# Patient Record
Sex: Female | Born: 1965
Health system: Southern US, Community
[De-identification: ages and names within clinical notes are randomized; demographics above are authoritative.]

## PROBLEM LIST (undated history)

## (undated) DIAGNOSIS — T7840XA Allergy, unspecified, initial encounter: Secondary | ICD-10-CM

## (undated) DIAGNOSIS — M549 Dorsalgia, unspecified: Secondary | ICD-10-CM

## (undated) DIAGNOSIS — R12 Heartburn: Secondary | ICD-10-CM

## (undated) DIAGNOSIS — E7800 Pure hypercholesterolemia, unspecified: Secondary | ICD-10-CM

## (undated) DIAGNOSIS — H409 Unspecified glaucoma: Secondary | ICD-10-CM

## (undated) DIAGNOSIS — I10 Essential (primary) hypertension: Secondary | ICD-10-CM

## (undated) DIAGNOSIS — H269 Unspecified cataract: Secondary | ICD-10-CM

## (undated) DIAGNOSIS — R7303 Prediabetes: Secondary | ICD-10-CM

## (undated) DIAGNOSIS — E559 Vitamin D deficiency, unspecified: Secondary | ICD-10-CM

## (undated) DIAGNOSIS — R011 Cardiac murmur, unspecified: Secondary | ICD-10-CM

## (undated) DIAGNOSIS — M542 Cervicalgia: Secondary | ICD-10-CM

## (undated) HISTORY — DX: Allergy, unspecified, initial encounter: T78.40XA

## (undated) HISTORY — DX: Vitamin D deficiency, unspecified: E55.9

## (undated) HISTORY — DX: Cardiac murmur, unspecified: R01.1

## (undated) HISTORY — DX: Pure hypercholesterolemia, unspecified: E78.00

## (undated) HISTORY — DX: Dorsalgia, unspecified: M54.9

## (undated) HISTORY — DX: Cervicalgia: M54.2

## (undated) HISTORY — DX: Unspecified cataract: H26.9

## (undated) HISTORY — DX: Essential (primary) hypertension: I10

## (undated) HISTORY — PX: OOPHORECTOMY: SHX86

## (undated) HISTORY — DX: Heartburn: R12

## (undated) HISTORY — PX: CARPAL TUNNEL RELEASE: SHX101

## (undated) HISTORY — DX: Prediabetes: R73.03

## (undated) HISTORY — DX: Unspecified glaucoma: H40.9

---

## 2000-10-15 ENCOUNTER — Encounter: Payer: Self-pay | Admitting: Family Medicine

## 2000-10-15 ENCOUNTER — Ambulatory Visit (HOSPITAL_COMMUNITY): Admission: RE | Admit: 2000-10-15 | Discharge: 2000-10-15 | Payer: Self-pay | Admitting: Family Medicine

## 2000-11-18 ENCOUNTER — Encounter: Payer: Self-pay | Admitting: Obstetrics and Gynecology

## 2000-11-18 ENCOUNTER — Ambulatory Visit (HOSPITAL_COMMUNITY): Admission: RE | Admit: 2000-11-18 | Discharge: 2000-11-18 | Payer: Self-pay | Admitting: Obstetrics and Gynecology

## 2001-02-05 ENCOUNTER — Encounter (INDEPENDENT_AMBULATORY_CARE_PROVIDER_SITE_OTHER): Payer: Self-pay

## 2001-02-05 ENCOUNTER — Inpatient Hospital Stay (HOSPITAL_COMMUNITY): Admission: RE | Admit: 2001-02-05 | Discharge: 2001-02-06 | Payer: Self-pay | Admitting: Obstetrics and Gynecology

## 2002-01-30 ENCOUNTER — Emergency Department (HOSPITAL_COMMUNITY): Admission: EM | Admit: 2002-01-30 | Discharge: 2002-01-30 | Payer: Self-pay | Admitting: Emergency Medicine

## 2002-02-05 ENCOUNTER — Encounter: Payer: Self-pay | Admitting: *Deleted

## 2002-02-05 ENCOUNTER — Ambulatory Visit (HOSPITAL_COMMUNITY): Admission: RE | Admit: 2002-02-05 | Discharge: 2002-02-05 | Payer: Self-pay | Admitting: *Deleted

## 2003-06-02 ENCOUNTER — Other Ambulatory Visit: Admission: RE | Admit: 2003-06-02 | Discharge: 2003-06-02 | Payer: Self-pay | Admitting: Family Medicine

## 2008-02-16 ENCOUNTER — Other Ambulatory Visit: Admission: RE | Admit: 2008-02-16 | Discharge: 2008-02-16 | Payer: Self-pay | Admitting: Family Medicine

## 2008-10-08 ENCOUNTER — Ambulatory Visit (HOSPITAL_BASED_OUTPATIENT_CLINIC_OR_DEPARTMENT_OTHER): Admission: RE | Admit: 2008-10-08 | Discharge: 2008-10-08 | Payer: Self-pay | Admitting: Orthopedic Surgery

## 2008-10-22 ENCOUNTER — Ambulatory Visit (HOSPITAL_BASED_OUTPATIENT_CLINIC_OR_DEPARTMENT_OTHER): Admission: RE | Admit: 2008-10-22 | Discharge: 2008-10-22 | Payer: Self-pay | Admitting: Orthopedic Surgery

## 2009-02-10 HISTORY — PX: CARPAL TUNNEL RELEASE: SHX101

## 2009-03-13 HISTORY — PX: CARPAL TUNNEL RELEASE: SHX101

## 2010-11-23 LAB — POCT HEMOGLOBIN-HEMACUE: Hemoglobin: 13.3 g/dL (ref 12.0–15.0)

## 2010-11-28 LAB — POCT HEMOGLOBIN-HEMACUE: Hemoglobin: 13.3 g/dL (ref 12.0–15.0)

## 2010-12-26 NOTE — Op Note (Signed)
Haley Moreno, Haley Moreno            ACCOUNT NO.:  [id number]   MEDICAL RECORD NO.:  [id number]          PATIENT TYPE:  AMB   LOCATION:  DSC                          FACILITY:  [facility]   PHYSICIAN:  Katy Fitch. Sypher, M.D. DATE OF BIRTH:  Feb 01, 1966   DATE OF PROCEDURE:  10/22/2008  DATE OF DISCHARGE:                               OPERATIVE REPORT   PREOPERATIVE DIAGNOSIS:  Entrapment neuropathy, median nerve, left  carpal tunnel.   POSTOPERATIVE DIAGNOSIS:  Entrapment neuropathy, median nerve, left  carpal tunnel.   OPERATION:  Release of left transverse carpal ligament.   OPERATING SURGEON:  Katy Fitch. Sypher, MD   ASSISTANT:  Marveen Reeks Dasnoit, PA-C   ANESTHESIA:  General by LMA.   SUPERVISING ANESTHESIOLOGIST:  Zenon Mayo, MD   INDICATIONS:  Haley Moreno is a 45 year old employee of [employer].  She presented for evaluation and management of hand numbness  and discomfort.  Clinical examination revealed signs of bilateral carpal  tunnel syndrome.  We have been unsuccessful controlling her symptoms  with nonoperative measures; therefore, she is brought to the operating  at this time for release of her transverse carpal ligaments.  She is  status post release of the right transverse carpal ligament with an  excellent short-term result.  She now presents for release of the left  transverse carpal ligament.   Preoperatively, questions were invited and answered in detail.   PROCEDURE:  Haley Moreno was brought to the operating room and  placed in the supine position upon the operating table.   Following induction of general anesthesia by LMA technique, the left arm  was prepped with Betadine soap solution and sterilely draped.  A  pneumatic tourniquet was applied to the proximal left brachium.   Following exsanguination of the left arm with Esmarch bandage, arterial  tourniquet was inflated to 220 mmHg.  The procedure commenced with a  short incision  in the line of the ring finger in the palm.  Subcutaneous  tissues were carefully divided revealing the palmar fascia.  This was  split longitudinally to reveal the common sensory branch of the median  nerve.  These were followed back to transverse carpal ligament, which  was gently isolated from median nerve.  The ligament was then released  along its ulnar border extending into the distal forearm.  This widely  opened carpal canal.  No masses or other predicaments were noted.  Bleeding points along the margin of the released ligament were  electrocauterized with bipolar current followed by repair of the skin  with intradermal 3-0 Prolene suture.  A compressive dressing was applied  with a volar plaster splint maintaining the wrist in 5 degrees of  dorsiflexion.   For aftercare, Haley Moreno was provided Percocet for pain.  I will see  her back for followup in 1 week or sooner p.r.n. problems.      Katy Fitch Sypher, M.D.  Electronically Signed     RVS/MEDQ  D:  10/22/2008  T:  10/22/2008  Job:  [id number]   cc:   Renaye Rakers, M.D.

## 2010-12-26 NOTE — Op Note (Signed)
Haley Moreno, Haley Moreno            ACCOUNT NO.:  [id number]   MEDICAL RECORD NO.:  [id number]          PATIENT TYPE:  AMB   LOCATION:  DSC                          FACILITY:  [facility]   PHYSICIAN:  Katy Fitch. Sypher, M.D. DATE OF BIRTH:  Apr 05, 1966   DATE OF PROCEDURE:  10/08/2008  DATE OF DISCHARGE:                               OPERATIVE REPORT   PREOPERATIVE DIAGNOSIS:  Entrapped neuropathy, right median nerve at  carpal tunnel.   POSTOPERATIVE DIAGNOSIS:  Entrapped neuropathy, right median nerve at  carpal tunnel.   OPERATION:  Release of right transcarpal ligament.   OPERATIONS:  Katy Fitch. Sypher, MD   ASSISTANT:  Marveen Reeks Dasnoit, PA-C   ANESTHESIA:  General by LMA.   SUPERVISING ANESTHESIOLOGIST:  Quita Skye. Krista Blue, MD   INDICATIONS:  Haley Moreno is a 45 year old woman referred for  evaluation and management of hand numbness.  Clinical examination  revealed signs of significant bilateral carpal tunnel syndrome.   Electrodiagnostic studies confirmed significant median neuropathy.   Due to failed respond to nonoperative measures, she is brought to the  operating room at this time for release of her right transverse carpal  ligament.   Preoperatively, she was interviewed by Dr. Krista Blue.  General anesthesia  by LMA technique was recommended and accepted.  She was subsequently  brought to room #1 of the [facility], placed in the supine  position on the operating table, and under Dr. Robina Ade direct  supervision general anesthesia by LMA technique induced.  The right arm  was then prepped with Betadine soap and solution, sterilely draped.  A  pneumatic tourniquet was applied to proximal brachium.   Following exsanguination of the arm with an Esmarch bandage, arterial  tourniquet was inflated to 230 mmHg.  The procedure commenced with a  short incision in the line of the ring finger of the palm.  Subcutaneous  tissues were carefully divided via the palmar  fascia.  This split  longitudinally to the common sensory branch of the median nerve.  These  were followed back to transcarpal ligament which gently isolated from  median nerve.   The ligament was then released along its ulnar border extending into the  distal forearm.  This widely opened the carpal canal.  No masses or  other predicaments were noted.   Bleeding points along the margin of the released ligament were  electrocauterized with bipolar forceps.  Following complete release of  the transverse carpal ligament, the canal was widely opened.  No masses  or predicaments were noted.  The tenosynovium of the ulnar bursa was  quite fibrotic.   The wound was then repaired with intradermal 3-0 Prolene.  A compressive  dressing was with a volar plaster splint maintaining the wrist in 5  degrees of dorsiflexion.   For aftercare, Ms. Gellatly was provided a prescription for Vicodin 5 mg  1 p.o. q.4-6 h. p.r.n. pain, 20 tablets without refill.  We will see her  back in followup in 7-10 days for suture removal and initiation of her  therapy program.      Katy Fitch. Sypher, M.D.  Electronically  Signed     RVS/MEDQ  D:  10/08/2008  T:  10/08/2008  Job:  [id number]

## 2010-12-29 NOTE — Discharge Summary (Signed)
[facility]  Patient:    Haley Moreno, Haley Moreno                   MRN: [id number] Adm. Date:  [id number] Disc. Date: [id number] Attending:  Madelyn Flavors CC:         Stacie Acres. White, M.D. at [facility]   Discharge Summary  HISTORY OF PRESENT ILLNESS:   The patient is a 45 year old, gravida 5, para 3, black female who presented to our office at the end of March complaining of a three-week history of right lower quadrant pain. A CT scan had shown a right ovarian mass measuring 6.5 x 3.5 cm with several fat lobules consistent with a dermoid. She subsequently underwent an ultrasound which confirmed a benign cystic teratoma and was admitted for removal of this dermoid. For further details, please see the dictated history and physical.  HOSPITAL COURSE:              The patient was admitted and underwent an exploratory laparotomy, right salpingo-oophorectomy, exploration of the left ovary to rule out a bilateral dermoid. For further details, please see the operative report. On postoperative day #1, the patient was afebrile with stable vital signs and no calf tenderness, dressing was clean and dry. Her postoperative hemoglobin was noted to be 10.7. The patient at that point desired discharged. She was discharged home.  DISCHARGE MEDICATIONS:        Tylox, dispense 20, one to two p.o. q.3-4h. prn pain.  DISCHARGE INSTRUCTIONS:       The patient was urged to return for staple removal in one week and to call with any problems. She is also urged to ambulate but to do no heavy lifting. DD:  03/14/01 TD:  03/14/01 Job: [id number]

## 2010-12-29 NOTE — H&P (Signed)
[facility]  Patient:    Haley Moreno, Haley Moreno                     MRN: [id number] Adm. Date:  02/05/01 Attending:  Beather Arbour. Thomasena Edis, M.D.                         History and Physical  DATE OF BIRTH:                May 22, 1966  HISTORY OF PRESENT ILLNESS:   The patient is a 45 year old gravida 5, para 3, SAB black female who presented to my office at the end of March complaining of a three week history of right lower quadrant pain.  She had a CT scan of the abdomen ordered by her family physician, Dr. Laurann Montana, which showed a right ovarian mass measuring 6.5 x 3.5 cm with several fat lobules consistent with a dermoid.  She subsequently had pelvic ultrasound as a workup where she was found to have a mixed echogenic mass in the right ovary measuring 4.5 x 2.9 x 2.8 cm consistent with a small cystic teratoma with a small amount of fluid adjacent to the right ovary.  Because this is a cystic teratoma the patient was advised to have this removed.  She was made aware that we would attempt to do an ovarian cystectomy but a right salpingo-oophorectomy may be necessary.  She was also made aware of the bilateral reality of this.  Risks of surgery including anesthetic complications, hemorrhage, infection, damage to adjacent structures including bladder, bowel, blood vessels, ureters discussed with the patient.  She was made aware of postoperative risks which could include wound infection, urinary tract infection, and DVT which could result in a stroke or pulmonary embolism as well as other unforeseen risks. She expresses understanding of and acceptance of these risks and desires to proceed.  PAST GYNECOLOGICAL HISTORY:   Menarche at age 69.  Cycles are every 28 days with a three to four day duration of flow.  PAST OBSTETRICAL HISTORY:     Patient has had three spontaneous vaginal deliveries.  PAST MEDICAL HISTORY:         History of a heart murmur  requiring no prophylactic antibiotics.  ALLERGIES:                    No known drug allergies.  MEDICATIONS:                  None.  PAST SURGICAL HISTORY:        None.  INJURIES:                     Burn to the right forearm due to boiling water while boiling eggs for her children.  FAMILY HISTORY:               Patients mother died at 42 of peritonitis. Father died at 77 of cirrhosis of the liver and hypertension.  One brother age 38 alive and well.  One sister age 47 alive and well.  Children ages 55, 37, and 60 all alive and well.  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  She is married and denies tobacco use.  She uses occasional alcohol and no recreational drugs.  REVIEW  OF SYSTEMS:            Denies headache, visual changes, chest pain, shortness of breath, change in bowel habits, unintentional weight loss, dysuria, urgency, frequency, pain or bleeding with intercourse.  PHYSICAL EXAMINATION  VITAL SIGNS:                  Blood pressure 122/78, heart rate 78, weight 141 pounds, height 5 feet 2.5 inches.  GENERAL:                      Well-developed black female.  HEENT:                        Normal.  NECK:                         Supple without thyromegaly.  CARDIAC:                      Regular rate and rhythm.  LUNGS:                        Clear to auscultation.  BREASTS:                      Without masses, dimpling, or retraction.  ABDOMEN:                      Soft and nontender.  EXTREMITIES:                  No cyanosis, clubbing, or edema.  NEUROLOGIC:                   Alert and oriented x 3.  PELVIC:                       Normal external female genitalia.  No vulvar, vaginal, or cervical lesions.  Pap smear performed within the last few months by Dr. Cliffton Asters was within normal limits.  Bimanual examination reveals the uterus to be mid position, six weeks, nontender without any nodules,  and mobile.  No adnexal mass was palpated.  RECTAL:                       Confirmatory.  IMPRESSION AND PLAN:          Patient is a 45 year old black female gravida 5, para 3 with a dermoid cyst of the right ovary for right ovarian cystectomy, possible right salpingo-oophorectomy.  Risks have been explained to the patient.  She expresses understanding of and acceptance of these risks.  It was explained to her that this does appear to be a dermoid but there is always a possibility of malignancy. DD:  02/05/01 TD:  02/05/01 Job: [id number]

## 2010-12-29 NOTE — Op Note (Signed)
[facility]  Patient:    Haley Moreno, Haley Moreno                   MRN: [id number] Adm. Date:  [id number] Attending:  Madelyn Flavors CC:         Raynald Kemp, M.D.  [facility], [address]  Stacie Acres. Cliffton Asters, M.D.   Operative Report  PREOPERATIVE DIAGNOSIS:       Right ovarian dermoid cyst.  POSTOPERATIVE DIAGNOSIS:      Right ovarian dermoid cyst, normal left ovary.  OPERATION:                    Exploratory laparotomy, right                               salpingo-oophorectomy and exploration of the                               left ovary to rule out bilateral dermoids.  SURGEON:                      Beather Arbour. Thomasena Edis, M.D.  ASSISTANT:                    Raynald Kemp, M.D.  ANESTHESIA:                   General endotracheal.  FLUIDS:                       1500 cc of crystalloid and cefotetan IV                               preoperatively.  DRAINS:                       Foley.  COMPLICATIONS:                None.  ESTIMATED BLOOD LOSS:         40 cc.  FINDINGS:                     Right ovarian dermoid cyst without any identifiable plane with which to perform an ovarian cystectomy.  A very significantly enlarged left ovary suspicious for possible dermoid due to firmness and complex cystic appearance, however, exploration revealed corpus luteum and multiple simple cysts.  DESCRIPTION OF PROCEDURE:     The patient was brought to the operating room, identified on the operating room table.  After induction of adequate general endotracheal anesthesia, the patient was placed in the supine position and prepped and draped in the usual sterile fashion.  A Foley catheter was placed. A Pfannenstiel incision was made and carried down to the fascia.  The fascia was scored in the midline and extended bilaterally using Mayo scissors.  It was then separated free from the underlying muscles.  The patient was placed in Trendelenburg and the  peritoneum was entered sharply and carefully, taking care to avoid bowel or other abdominal contents.  The peritoneal incision was extended superiorly, then inferiorly down to the bladder edge.  Careful exploration of the abdominal pelvic cavity was performed, the liver edge was smooth, there were no enlarged pelvic or para-aortic  nodes.  Omentum was noted to be smooth and all peritoneal surfaces were smooth.  The uterus was noted to be freely mobile, both tubes appeared normal.  The left ovary was inspected and noted to have a yellowish appearance with a complex cystic appearance, thus suggesting the possibility of ad additional dermoid on the left ovary. The right ovary was identified and noted to be markedly enlarged, approximately 5 cm.  Very careful inspection of this ovary revealed absolutely no cleavage plane with which to shell out the dermoid.  It was the opinion of the surgeon and assistant that to attempt to do this could easily result in spillage and possible chemical peritonitis.  The patient desires no further children.  The decision was made that it would be safer to perform a right salpingo-oophorectomy as no cyst plane could be identified at all.  Prior to performing this, the left ovary was opened along its longitudinal access and because of the yellowish appearance of the ovary and multiple points. However, there was noted to be corpus luteum and multiple simple cysts, no evidence of ovarian dermoid.  However, palpation of ovary head revealed an area of firmness thought to be adjacent to the yellowish appearing areas suspicious for a dermoid.  After the left ovary was explored, it was then reapproximated using a simple running interlocking suture of 2-0 Monocryl. There was one bleeding edge and a figure-of-eight suture of 3-0 Monocryl and an ______ needle was placed.  Excellent hemostasis was noted along the left ovary.  Attention was then turned to the right ovary.  The  fallopian tube and the utero-ovarian ligament were clamped with two Haneys, cut and sent to pathology for examination.  The pedicle was then tied with a ______ of 0 Monocryl and then suture ligated with a suture of 0 Monocryl.  Excellent hemostasis was then noted at this pedicle.  Both the uterus and left ovary tube were allowed to return to their normal anatomic position after excellent hemostasis was noted at all pedicle sites.  The pelvis was irrigated copiously with warm lactated Ringers.  Kelly clamps were placed on the peritoneum and the peritoneum was closed using a simple running suture of 2-0 Monocryl. ______ fascial areas were examined for hemostasis and excellent hemostasis was achieved using cautery.  The fascia was closed using a suture of 0 PDS with a suture anchored at the left most angle of the incision run through the right most angle of the incision and tied.  The subcutaneous tissue was irrigated copiously with warm lactated Ringers after excellent fascial closure was assured.  The skin was closed with staples.  The patient tolerated the procedure well without apparent complications and transferred to the recovery room in stable condition.  After, instrument, sponge, needle counts were correct. DD:  02/05/01 TD:  02/05/01 Job: [id number]

## 2012-08-26 ENCOUNTER — Ambulatory Visit (INDEPENDENT_AMBULATORY_CARE_PROVIDER_SITE_OTHER): Payer: BC Managed Care – PPO | Admitting: Family Medicine

## 2012-08-26 VITALS — BP 124/86 | HR 103 | Temp 99.2°F | Resp 18 | Ht 63.5 in | Wt 156.0 lb

## 2012-08-26 DIAGNOSIS — J101 Influenza due to other identified influenza virus with other respiratory manifestations: Secondary | ICD-10-CM

## 2012-08-26 DIAGNOSIS — J111 Influenza due to unidentified influenza virus with other respiratory manifestations: Secondary | ICD-10-CM

## 2012-08-26 DIAGNOSIS — R509 Fever, unspecified: Secondary | ICD-10-CM

## 2012-08-26 DIAGNOSIS — R05 Cough: Secondary | ICD-10-CM

## 2012-08-26 LAB — POCT INFLUENZA A/B
Influenza A, POC: POSITIVE
Influenza B, POC: NEGATIVE

## 2012-08-26 MED ORDER — HYDROCODONE-HOMATROPINE 5-1.5 MG/5ML PO SYRP: 5.0000 mL | ORAL_SOLUTION | ORAL | Status: DC | PRN

## 2012-08-26 MED ORDER — OSELTAMIVIR PHOSPHATE 75 MG PO CAPS: 75.0000 mg | ORAL_CAPSULE | Freq: Two times a day (BID) | ORAL | Status: DC

## 2012-08-26 NOTE — Progress Notes (Signed)
Subjective: Late Saturday the patient had a little bit of a sore throat. Not he should feel a lot worse, and was taking cough and cold medication last night because she was very achy headache and probably feverish. She still has a sore throat. She is coughing. No flu shot.  Objective: Pleasant lady who looks ill. Her TMs are normal. Nose congested. Has a little watery.. Throat mild edema of the uvula and pharynx. Neck supple without significant nodes. Chest is clear to auscultation . Coughs when he breathes deep.  Assessment: Viral syndrome, probable influenza, with URI cough.  Plan: Flu swab  Results for orders placed in visit on 08/26/12  POCT INFLUENZA A/B      Component Value Range   Influenza A, POC Positive     Influenza B, POC Negative     rx with tamiflu

## 2012-08-26 NOTE — Patient Instructions (Signed)

## 2013-05-26 ENCOUNTER — Other Ambulatory Visit (HOSPITAL_COMMUNITY)
Admission: RE | Admit: 2013-05-26 | Discharge: 2013-05-26 | Disposition: A | Discharge status: Home / Self Care | Payer: BC Managed Care – PPO | Source: Ambulatory Visit | Attending: Family Medicine | Admitting: Family Medicine

## 2013-05-26 DIAGNOSIS — N760 Acute vaginitis: Secondary | ICD-10-CM | POA: Insufficient documentation

## 2013-05-26 DIAGNOSIS — Z113 Encounter for screening for infections with a predominantly sexual mode of transmission: Secondary | ICD-10-CM | POA: Insufficient documentation

## 2013-10-15 ENCOUNTER — Ambulatory Visit: Payer: BC Managed Care – PPO | Admitting: Dietician

## 2013-12-23 ENCOUNTER — Encounter: Payer: BC Managed Care – PPO | Attending: Obstetrics and Gynecology | Admitting: Dietician

## 2013-12-23 ENCOUNTER — Encounter: Payer: Self-pay | Admitting: Dietician

## 2013-12-23 VITALS — Ht 62.0 in | Wt 152.8 lb

## 2013-12-23 DIAGNOSIS — R7303 Prediabetes: Secondary | ICD-10-CM | POA: Insufficient documentation

## 2013-12-23 DIAGNOSIS — I10 Essential (primary) hypertension: Secondary | ICD-10-CM

## 2013-12-23 DIAGNOSIS — R7309 Other abnormal glucose: Secondary | ICD-10-CM | POA: Insufficient documentation

## 2013-12-23 DIAGNOSIS — Z713 Dietary counseling and surveillance: Secondary | ICD-10-CM | POA: Insufficient documentation

## 2013-12-23 NOTE — Progress Notes (Signed)
Appt start time: 1600 end time:  1700.  Assessment:  Patient was seen on 12/23/13 for individual diabetes education. Pt referred for Pre-diabetes counseling with mild elevation to HgbA1c, other risks include AA race, overweight.   Current HbA1c: 5.8  Preferred Learning Style:   No preference indicated   Learning Readiness:   Contemplating  MEDICATIONS: see list.  DIETARY INTAKE: Usual eating pattern includes 3 meals and 1-2 snacks per day. Everyday foods include smoothie, tea, juice.  Avoided foods include none noted.    24-hr recall:  B ( AM): smoothie- sweetened almond milk (10 oz.), half banana, cup mixed frozen fruit, cup mixed greens; ginger tea plain  Snk ( AM): none. Rarely some nuts or seeds  L ( PM): another smoothie. Tea and water Snk ( PM): veggie stix or another snack food her kids have. Sharp cheddar cheese with crackers. Water and lemonade, cranberry juice are drink options. D ( PM): bourbon chix with jambalaya rice and cabbage; everything salad. Baked chix with sweet potatoes and cabbage, kale, or asparagus. Usually cranberry juice with dinner. "Addicted to" mcdonalds strawberry lemonade the past 4 days.  Snk ( PM): homemade cake, or similar to afternoon Beverages: tea, smoothies, water, lemonade, cranberry juice, grape juice, no sodas, no kool aid, EtOH about once a month, coffee about half the days per week- usually espressos with sugar and cream  Usual physical activity: on weekends with play tennis, wears a pedometer and walks about 8000 steps per day. Usually walks about 30 minutes during lunch break because she eats a smoothie, walks dog in mornings and evening. [place] about 4 laps.  Progress Towards Goal(s):  In progress.   Nutritional Diagnosis:  NI-5.8.2 Excessive carbohydrate intake As related to frequent consumption of sweetened beverages, HTN, pre-diabetes.  As evidenced by pt diet recall, HgbA1c 5.8.    Intervention:  Nutrition counseling  provided.  Discussed diabetes disease process and treatment options.  Discussed physiology of diabetes and role of obesity on insulin resistance.  Encouraged moderate weight reduction to improve glucose levels.  Discussed role of medications and diet in glucose control  Provided education on macronutrients on glucose levels.  Provided education on carb counting, importance of regularly scheduled meals/snacks, and meal planning  Discussed effects of physical activity on glucose levels and long-term glucose control.  Recommended 150 minutes of physical activity/week.  Reviewed patient medications.  Discussed role of medication on blood glucose and possible side effects  Discussed blood glucose monitoring and interpretation.  Discussed recommended target ranges and individual ranges.    Described short-term complications: hyper- and hypo-glycemia.  Discussed causes,symptoms, and treatment options.  Discussed prevention, detection, and treatment of long-term complications.  Discussed the role of prolonged elevated glucose levels on body systems.  Discussed role of stress on blood glucose levels and discussed strategies to manage psychosocial issues.  Discussed recommendations for long-term diabetes self-care.  Established checklist for medical, dental, and emotional self-care.  Teaching Method Utilized:  Visual Auditory  Handouts given during visit include:  Living Well with Diabetes  Best Pro, Fat, and CHO rich foods  Barriers to learning/adherence to lifestyle change: preference for sweetened beverages  Diabetes self-care support plan:   [facility] support group  RD established goals with patient to include: 60 minutes daily exercise, limit to 2 sweet drinks per week, eat according to the Plate Method for lunch and dinner, or utilize her smoothie, but include a protein (e.g. Replace almond milk with lactose free milk or greek yogurt).  Demonstrated degree of understanding via:  Teach  Back   Monitoring/Evaluation:  Dietary intake, exercise, portion control, and body weight in 3 month(s).

## 2014-03-26 ENCOUNTER — Ambulatory Visit: Payer: BC Managed Care – PPO | Admitting: Dietician

## 2014-05-14 ENCOUNTER — Encounter: Payer: Self-pay | Admitting: Dietician

## 2014-05-14 ENCOUNTER — Encounter: Payer: BC Managed Care – PPO | Attending: Obstetrics and Gynecology | Admitting: Dietician

## 2014-05-14 VITALS — Ht 62.0 in | Wt 149.7 lb

## 2014-05-14 DIAGNOSIS — R7309 Other abnormal glucose: Secondary | ICD-10-CM | POA: Insufficient documentation

## 2014-05-14 DIAGNOSIS — Z713 Dietary counseling and surveillance: Secondary | ICD-10-CM | POA: Insufficient documentation

## 2014-05-14 DIAGNOSIS — R7303 Prediabetes: Secondary | ICD-10-CM

## 2014-05-14 NOTE — Progress Notes (Signed)
  Medical Nutrition Therapy:  Appt start time: 1200 end time:  1230.   Assessment:  Primary concerns today: Haley Moreno is here for follow up for prediabetes, previously followed by Achilles Dunk, RD.  Patient reports with mild weight loss.  States she had lose down to 145 lb and has regained some.  She states she has been more aware of her food choices and is exercising.  She saves her out-to-eat meals for the weekend and tries to order salads at steak house, or will go to [place] or other [place] places.  Patient has not had an updated HgA1c score since last visit.  Prior A1c was 5.8%.  Wt Readings from Last 3 Encounters:  05/14/14 149 lb 11.2 oz (67.903 kg)  12/23/13 152 lb 12.8 oz (69.31 kg)  08/26/12 156 lb (70.761 kg)   Ht Readings from Last 3 Encounters:  05/14/14 5\' 2"  (1.575 m)  12/23/13 5\' 2"  (1.575 m)  08/26/12 5' 3.5" (1.613 m)   Body mass index is 27.37 kg/(m^2).   DIETARY INTAKE:  Usual eating pattern includes 3 meals and 1-2 snacks per day.  24-hr recall:  B ( AM): smoothie with kale or chia seeds or yogurt and fruit Snk ( AM): none  L ( PM): grilled chicken with romaine and spinach Snk ( PM): greek yogurt D ( PM): roasted chicken with green vegetables Snk ( PM): none Beverages: water with crystal light, occasional pineapple juice on weekends, coffee with splash of 2% milk, 2 packets of sugar  Usual physical activity: walking 4 days a week for 4-6 miles which takes about 2 hours, getting 10,000 steps per day on FitBit, does jumping jacks or crunches at her home  Intervention:  Nutrition education for blood sugar and weight management with diet/lifestyle.  Reviewed MyPlate portion method for healthy, balanced meals.  Reviewed pairing protein and carbohydrates at snacks and provided handout.  Discussed beverage choices.  Praised her for reducing sugary, caloric drinks.  Recommended diluting juice with water and watching portion size.  Reviewed goals of lowering A1c score and  recommended she have that test done again this month.  Encouraged her to keep up the regular exercise and increase gradually.  Stated she could increase her steps per day goal.  Stated as the weather gets colder she should brainstorm ways to stay active indoors or buy warm clothing for working out.    Teaching Method Utilized: Visual Auditory  Handouts given during visit include:  MyPlate  Low Carb Snack Suggestions  Barriers to learning/adherence to lifestyle change: none  Demonstrated degree of understanding via:  Teach Back   Monitoring/Evaluation:  Dietary intake, exercise, labs, and body weight in 5 month(s).

## 2014-06-12 ENCOUNTER — Ambulatory Visit (INDEPENDENT_AMBULATORY_CARE_PROVIDER_SITE_OTHER): Payer: BC Managed Care – PPO | Admitting: Emergency Medicine

## 2014-06-12 VITALS — BP 134/91 | HR 76 | Temp 98.2°F | Resp 18 | Ht 62.25 in | Wt 150.6 lb

## 2014-06-12 DIAGNOSIS — J209 Acute bronchitis, unspecified: Secondary | ICD-10-CM

## 2014-06-12 DIAGNOSIS — J018 Other acute sinusitis: Secondary | ICD-10-CM

## 2014-06-12 DIAGNOSIS — J40 Bronchitis, not specified as acute or chronic: Secondary | ICD-10-CM

## 2014-06-12 MED ORDER — ALBUTEROL SULFATE HFA 108 (90 BASE) MCG/ACT IN AERS: 2.0000 | INHALATION_SPRAY | RESPIRATORY_TRACT | Status: DC | PRN

## 2014-06-12 MED ORDER — PROMETHAZINE-CODEINE 6.25-10 MG/5ML PO SYRP: 5.0000 mL | ORAL_SOLUTION | ORAL | Status: DC | PRN

## 2014-06-12 MED ORDER — AMOXICILLIN-POT CLAVULANATE 875-125 MG PO TABS: 1.0000 | ORAL_TABLET | Freq: Two times a day (BID) | ORAL | Status: DC

## 2014-06-12 MED ORDER — PSEUDOEPHEDRINE-GUAIFENESIN ER 60-600 MG PO TB12: 1.0000 | ORAL_TABLET | Freq: Two times a day (BID) | ORAL | Status: AC

## 2014-06-12 NOTE — Patient Instructions (Signed)
Metered Dose Inhaler (No Spacer Used) Inhaled medicines are the basis of treatment for asthma and other breathing problems. Inhaled medicine can only be effective if used properly. Good technique assures that the medicine reaches the lungs. Metered dose inhalers (MDIs) are used to deliver a variety of inhaled medicines. These include quick relief or rescue medicines (such as bronchodilators) and controller medicines (such as corticosteroids). The medicine is delivered by pushing down on a metal canister to release a set amount of spray. If you are using different kinds of inhalers, use your quick relief medicine to open the airways 10-15 minutes before using a steroid, if instructed to do so by your health care provider. If you are unsure which inhalers to use and the order of using them, ask your health care provider, nurse, or respiratory therapist. HOW TO USE THE INHALER 1. Remove the cap from the inhaler. 2. If you are using the inhaler for the first time, you will need to prime it. Shake the inhaler for 5 seconds and release four puffs into the air, away from your face. Ask your health care provider or pharmacist if you have questions about priming your inhaler. 3. Shake the inhaler for 5 seconds before each breath in (inhalation). 4. Position the inhaler so that the top of the canister faces up. 5. Put your index finger on the top of the medicine canister. Your thumb supports the bottom of the inhaler. 6. Open your mouth. 7. Either place the inhaler between your teeth and place your lips tightly around the mouthpiece, or hold the inhaler 1-2 inches away from your open mouth. If you are unsure of which technique to use, ask your health care provider. 8. Breathe out (exhale) normally and as completely as possible. 9. Press the canister down with the index finger to release the medicine. 10. At the same time as the canister is pressed, inhale deeply and slowly until your lungs are completely filled.  This should take 4-6 seconds. Keep your tongue down. 11. Hold the medicine in your lungs for 5-10 seconds (10 seconds is best). This helps the medicine get into the small airways of your lungs. 12. Breathe out slowly, through pursed lips. Whistling is an example of pursed lips. 13. Wait at least 1 minute between puffs. Continue with the above steps until you have taken the number of puffs your health care provider has ordered. Do not use the inhaler more than your health care provider directs you to. 14. Replace the cap on the inhaler. 15. Follow the directions from your health care provider or the inhaler insert for cleaning the inhaler. If you are using a steroid inhaler, after your last puff, rinse your mouth with water, gargle, and spit out the water. Do not swallow the water. AVOID:  Inhaling before or after starting the spray of medicine. It takes practice to coordinate your breathing with triggering the spray.  Inhaling through the nose (rather than the mouth) when triggering the spray. HOW TO DETERMINE IF YOUR INHALER IS FULL OR NEARLY EMPTY You cannot know when an inhaler is empty by shaking it. Some inhalers are now being made with dose counters. Ask your health care provider for a prescription that has a dose counter if you feel you need that extra help. If your inhaler does not have a counter, ask your health care provider to help you determine the date you need to refill your inhaler. Write the refill date on a calendar or your inhaler canister. Refill   your inhaler 7-10 days before it runs out. Be sure to keep an adequate supply of medicine. This includes making sure it has not expired, and making sure you have a spare inhaler. SEEK MEDICAL CARE IF:  Symptoms are only partially relieved with your inhaler.  You are having trouble using your inhaler.  You experience an increase in phlegm. SEEK IMMEDIATE MEDICAL CARE IF:  You feel little or no relief with your inhalers. You are still  wheezing and feeling shortness of breath, tightness in your chest, or both.  You have dizziness, headaches, or a fast heart rate.  You have chills, fever, or night sweats.  There is a noticeable increase in phlegm production, or there is blood in the phlegm. MAKE SURE YOU:  Understand these instructions.  Will watch your condition.  Will get help right away if you are not doing well or get worse. Document Released: 05/27/2007 Document Revised: 12/14/2013 Document Reviewed: 01/15/2013 [facility] Patient Information 2015 [place], [place]. This information is not intended to replace advice given to you by your health care provider. Make sure you discuss any questions you have with your health care provider. Sinusitis Sinusitis is redness, soreness, and inflammation of the paranasal sinuses. Paranasal sinuses are air pockets within the bones of your face (beneath the eyes, the middle of the forehead, or above the eyes). In healthy paranasal sinuses, mucus is able to drain out, and air is able to circulate through them by way of your nose. However, when your paranasal sinuses are inflamed, mucus and air can become trapped. This can allow bacteria and other germs to grow and cause infection. Sinusitis can develop quickly and last only a short time (acute) or continue over a long period (chronic). Sinusitis that lasts for more than 12 weeks is considered chronic.  CAUSES  Causes of sinusitis include:  Allergies.  Structural abnormalities, such as displacement of the cartilage that separates your nostrils (deviated septum), which can decrease the air flow through your nose and sinuses and affect sinus drainage.  Functional abnormalities, such as when the small hairs (cilia) that line your sinuses and help remove mucus do not work properly or are not present. SIGNS AND SYMPTOMS  Symptoms of acute and chronic sinusitis are the same. The primary symptoms are pain and pressure around the affected  sinuses. Other symptoms include:  Upper toothache.  Earache.  Headache.  Bad breath.  Decreased sense of smell and taste.  A cough, which worsens when you are lying flat.  Fatigue.  Fever.  Thick drainage from your nose, which often is green and may contain pus (purulent).  Swelling and warmth over the affected sinuses. DIAGNOSIS  Your health care provider will perform a physical exam. During the exam, your health care provider may:  Look in your nose for signs of abnormal growths in your nostrils (nasal polyps).  Tap over the affected sinus to check for signs of infection.  View the inside of your sinuses (endoscopy) using an imaging device that has a light attached (endoscope). If your health care provider suspects that you have chronic sinusitis, one or more of the following tests may be recommended:  Allergy tests.  Nasal culture. A sample of mucus is taken from your nose, sent to a lab, and screened for bacteria.  Nasal cytology. A sample of mucus is taken from your nose and examined by your health care provider to determine if your sinusitis is related to an allergy. TREATMENT  Most cases of acute sinusitis are  related to a viral infection and will resolve on their own within 10 days. Sometimes medicines are prescribed to help relieve symptoms (pain medicine, decongestants, nasal steroid sprays, or saline sprays).  However, for sinusitis related to a bacterial infection, your health care provider will prescribe antibiotic medicines. These are medicines that will help kill the bacteria causing the infection.  Rarely, sinusitis is caused by a fungal infection. In theses cases, your health care provider will prescribe antifungal medicine. For some cases of chronic sinusitis, surgery is needed. Generally, these are cases in which sinusitis recurs more than 3 times per year, despite other treatments. HOME CARE INSTRUCTIONS   Drink plenty of water. Water helps thin the  mucus so your sinuses can drain more easily.  Use a humidifier.  Inhale steam 3 to 4 times a day (for example, sit in the bathroom with the shower running).  Apply a warm, moist washcloth to your face 3 to 4 times a day, or as directed by your health care provider.  Use saline nasal sprays to help moisten and clean your sinuses.  Take medicines only as directed by your health care provider.  If you were prescribed either an antibiotic or antifungal medicine, finish it all even if you start to feel better. SEEK IMMEDIATE MEDICAL CARE IF:  You have increasing pain or severe headaches.  You have nausea, vomiting, or drowsiness.  You have swelling around your face.  You have vision problems.  You have a stiff neck.  You have difficulty breathing. MAKE SURE YOU:   Understand these instructions.  Will watch your condition.  Will get help right away if you are not doing well or get worse. Document Released: 07/30/2005 Document Revised: 12/14/2013 Document Reviewed: 08/14/2011 [facility] Patient Information 2015 [place], [place]. This information is not intended to replace advice given to you by your health care provider. Make sure you discuss any questions you have with your health care provider.

## 2014-06-12 NOTE — Progress Notes (Signed)
[facility] [address] [place] [address] [phone]  Date:  06/12/2014   Name:  Haley Moreno   DOB:  1966/02/11   MRN:  [id number]  PCP:  No primary provider on file.    Chief Complaint: Nasal Congestion and Sore Throat   History of Present Illness:  Haley Moreno is a 48 y.o. very pleasant female patient who presents with the following:  Ill 3 days with nasal congestion and purulent drainage, post nasal drip.  Has a sore throat. Cough with no sputum production.  No wheezing or shortness of breath per patient has a history of reactive airway disease No fever or chills. Malaise, fatigue No improvement with over the counter medications or other home remedies.  Denies other complaint or health concern today.   Patient Active Problem List   Diagnosis Date Noted  . Pre-diabetes 12/23/2013  . Essential hypertension, benign 12/23/2013    Past Medical History  Diagnosis Date  . Heart murmur     Past Surgical History  Procedure Laterality Date  . Abdominal hysterectomy      History  Substance Use Topics  . Smoking status: Never Smoker   . Smokeless tobacco: Not on file  . Alcohol Use: Yes    Family History  Problem Relation Age of Onset  . Hypertension Father   . Asthma Daughter   . Asthma Son     No Known Allergies  Medication list has been reviewed and updated.  Current Outpatient Prescriptions on File Prior to Visit  Medication Sig Dispense Refill  . HYDROcodone-homatropine (HYCODAN) 5-1.5 MG/5ML syrup Take 5 mLs by mouth every 4 (four) hours as needed for cough.  120 mL  0  . oseltamivir (TAMIFLU) 75 MG capsule Take 1 capsule (75 mg total) by mouth 2 (two) times daily.  10 capsule  0   No current facility-administered medications on file prior to visit.    Review of Systems:  As per HPI, otherwise negative.    Physical Examination: Filed Vitals:   06/12/14 1126  BP: 134/91  Pulse: 76  Temp: 98.2 F (36.8  C)  Resp: 18   Filed Vitals:   06/12/14 1126  Height: 5' 2.25" (1.581 m)  Weight: 150 lb 9.6 oz (68.312 kg)   Body mass index is 27.33 kg/(m^2). Ideal Body Weight: Weight in (lb) to have BMI = 25: 137.5  GEN: WDWN, NAD, Non-toxic, A & O x 3 HEENT: Atraumatic, Normocephalic. Neck supple. No masses, No LAD. Ears and Nose: No external deformity. CV: RRR, No M/G/R. No JVD. No thrill. No extra heart sounds. PULM: scattered wheezes, no crackles, rhonchi. No retractions. No resp. distress. No accessory muscle use. ABD: S, NT, ND, +BS. No rebound. No HSM. EXTR: No c/c/e NEURO Normal gait.  PSYCH: Normally interactive. Conversant. Not depressed or anxious appearing.  Calm demeanor.    Assessment and Plan: Sinusitis Bronchitis with bronchospasm Albuterol mdi augmentin mucinex d Prom c cod  Signed,  Ellison Carwin, MD

## 2015-06-02 ENCOUNTER — Ambulatory Visit (INDEPENDENT_AMBULATORY_CARE_PROVIDER_SITE_OTHER): Payer: BLUE CROSS/BLUE SHIELD | Admitting: Physician Assistant

## 2015-06-02 VITALS — BP 122/80 | HR 82 | Temp 98.7°F | Resp 18 | Ht 63.0 in | Wt 167.0 lb

## 2015-06-02 DIAGNOSIS — J069 Acute upper respiratory infection, unspecified: Secondary | ICD-10-CM

## 2015-06-02 DIAGNOSIS — H6121 Impacted cerumen, right ear: Secondary | ICD-10-CM | POA: Diagnosis not present

## 2015-06-02 DIAGNOSIS — B9789 Other viral agents as the cause of diseases classified elsewhere: Principal | ICD-10-CM

## 2015-06-02 MED ORDER — NAPROXEN 500 MG PO TABS: 500.0000 mg | ORAL_TABLET | Freq: Two times a day (BID) | ORAL | Status: DC

## 2015-06-02 MED ORDER — HYDROCODONE-HOMATROPINE 5-1.5 MG/5ML PO SYRP: 5.0000 mL | ORAL_SOLUTION | Freq: Three times a day (TID) | ORAL | Status: DC | PRN

## 2015-06-02 MED ORDER — LORATADINE-PSEUDOEPHEDRINE ER 10-240 MG PO TB24: 1.0000 | ORAL_TABLET | Freq: Every day | ORAL | Status: DC

## 2015-06-02 NOTE — Progress Notes (Signed)
06/02/2015 at 3:11 PM  Haley Moreno / DOB: 24-Dec-1965 / MRN: [id number]  The patient has Pre-diabetes and Essential hypertension, benign on her problem list.  SUBJECTIVE  Haley Moreno is a 49 y.o. female complaining of nasal blockage, post nasal drip and sinus and nasal congestion that started 2 days ago.  Associated symptoms include cough today, and she denies fever, sore throat, difficulty breathing, headache and jaw pain.The patient symptoms show no change. Treatments tried thus far include cough suppressant of choice with poor relief. She reports sick contacts.   She  has a past medical history of Heart murmur.    Medications reviewed and updated by myself where necessary, and exist elsewhere in the encounter.   Haley Moreno has No Known Allergies. She  reports that she has never smoked. She does not have any smokeless tobacco history on file. She reports that she drinks alcohol. She reports that she does not use illicit drugs. She  reports that she currently engages in sexual activity and has had female partners. She reports using the following method of birth control/protection: None. The patient  has past surgical history that includes Abdominal hysterectomy.  Her family history includes Asthma in her daughter and son; Hypertension in her father.  Review of Systems  Constitutional: Negative for fever and chills.  Respiratory: Negative for shortness of breath.   Cardiovascular: Negative for chest pain.  Gastrointestinal: Negative for nausea and abdominal pain.  Genitourinary: Negative.   Skin: Negative for rash.  Neurological: Negative for dizziness and headaches.    OBJECTIVE  Her  height is 5\' 3"  (1.6 m) and weight is 167 lb (75.751 kg). Her oral temperature is 98.7 F (37.1 C). Her blood pressure is 122/80 and her pulse is 82. Her respiration is 18 and oxygen saturation is 97%.  The patient's body mass index is 29.59 kg/(m^2).  Physical Exam  Nursing note and  vitals reviewed. Constitutional: She is oriented to person, place, and time. She appears well-developed and well-nourished. No distress.  HENT:  Right Ear: Hearing and external ear normal.  Left Ear: Hearing, tympanic membrane, external ear and ear canal normal.  Ears:  Nose: Mucosal edema present. Right sinus exhibits no maxillary sinus tenderness and no frontal sinus tenderness. Left sinus exhibits no maxillary sinus tenderness and no frontal sinus tenderness.  Mouth/Throat: Uvula is midline, oropharynx is clear and moist and mucous membranes are normal.  Cardiovascular: Normal rate, regular rhythm and normal heart sounds.   Respiratory: Effort normal and breath sounds normal. She has no wheezes. She has no rales.  Neurological: She is alert and oriented to person, place, and time.  Skin: Skin is warm and dry. She is not diaphoretic.  Psychiatric: She has a normal mood and affect.    No results found for this or any previous visit (from the past 24 hour(s)).  ASSESSMENT & PLAN  Haley Moreno was seen today for cough and nasal congestion.  Diagnoses and all orders for this visit:  Viral URI with cough -     loratadine-pseudoephedrine (CLARITIN-D 24 HOUR) 10-240 MG 24 hr tablet; Take 1 tablet by mouth daily. Take in the morning. -     naproxen (NAPROSYN) 500 MG tablet; Take 1 tablet (500 mg total) by mouth 2 (two) times daily with a meal. -     HYDROcodone-homatropine (HYCODAN) 5-1.5 MG/5ML syrup; Take 5 mLs by mouth every 8 (eight) hours as needed for cough.  Cerumen impaction, right: Disimpacted in the office. TM pearly  grey with good cone of light.     The patient was advised to call or come back to clinic if she does not see an improvement in symptoms, or worsens with the above plan.   Philis Fendt, MHS, PA-C [facility] 06/02/2015 3:11 PM

## 2015-06-04 NOTE — Progress Notes (Signed)
  Medical screening examination/treatment/procedure(s) were performed by non-physician practitioner and as supervising physician I was immediately available for consultation/collaboration.     

## 2016-09-25 ENCOUNTER — Other Ambulatory Visit: Payer: Self-pay | Admitting: Obstetrics and Gynecology

## 2016-09-25 DIAGNOSIS — R928 Other abnormal and inconclusive findings on diagnostic imaging of breast: Secondary | ICD-10-CM

## 2016-10-10 ENCOUNTER — Other Ambulatory Visit: Payer: Self-pay

## 2016-12-04 ENCOUNTER — Ambulatory Visit
Admission: RE | Admit: 2016-12-04 | Discharge: 2016-12-04 | Disposition: A | Discharge status: Home / Self Care | Payer: BLUE CROSS/BLUE SHIELD | Source: Ambulatory Visit | Attending: Obstetrics and Gynecology | Admitting: Obstetrics and Gynecology

## 2016-12-04 DIAGNOSIS — R928 Other abnormal and inconclusive findings on diagnostic imaging of breast: Secondary | ICD-10-CM

## 2016-12-05 ENCOUNTER — Other Ambulatory Visit: Payer: Self-pay

## 2017-10-26 DIAGNOSIS — R030 Elevated blood-pressure reading, without diagnosis of hypertension: Secondary | ICD-10-CM | POA: Diagnosis not present

## 2017-10-26 DIAGNOSIS — J069 Acute upper respiratory infection, unspecified: Secondary | ICD-10-CM | POA: Diagnosis not present

## 2017-10-26 DIAGNOSIS — M79645 Pain in left finger(s): Secondary | ICD-10-CM | POA: Diagnosis not present

## 2018-04-17 ENCOUNTER — Ambulatory Visit: Payer: BLUE CROSS/BLUE SHIELD | Admitting: Nurse Practitioner

## 2018-07-18 ENCOUNTER — Ambulatory Visit: Payer: Self-pay

## 2018-07-18 ENCOUNTER — Ambulatory Visit: Payer: BLUE CROSS/BLUE SHIELD | Admitting: Family Medicine

## 2018-07-18 ENCOUNTER — Ambulatory Visit (INDEPENDENT_AMBULATORY_CARE_PROVIDER_SITE_OTHER)
Admission: RE | Admit: 2018-07-18 | Discharge: 2018-07-18 | Disposition: A | Discharge status: Home / Self Care | Payer: BLUE CROSS/BLUE SHIELD | Source: Ambulatory Visit | Attending: Family Medicine | Admitting: Family Medicine

## 2018-07-18 VITALS — BP 180/118 | HR 78 | Resp 16 | Wt 158.0 lb

## 2018-07-18 DIAGNOSIS — M25511 Pain in right shoulder: Secondary | ICD-10-CM

## 2018-07-18 DIAGNOSIS — G5610 Other lesions of median nerve, unspecified upper limb: Secondary | ICD-10-CM | POA: Diagnosis not present

## 2018-07-18 MED ORDER — PREDNISONE 5 MG PO TABS: ORAL_TABLET | ORAL | 0 refills | Status: DC

## 2018-07-18 NOTE — Progress Notes (Signed)
Haley Moreno - 52 y.o. female MRN [id number]  Date of birth: January 17, 1966  SUBJECTIVE:  Including CC & ROS.  No chief complaint on file.   Haley Moreno is a 52 y.o. female that is presenting with right shoulder pain and the inability to flex her thumb.  Having generalized right shoulder pain.  This started about 3 weeks ago.  She denies any inciting event.  Pain radiates down to the elbow.  Has not had any improvement with over-the-counter medications.  Pain is intermittent.  Having more pain when she lies on the affected side.  Denies any radicular symptoms.  Denies any history of surgery on the shoulder.  Symptoms are moderate in severity.  She has been unable to flex the IP joint of the left thumb.  This is been ongoing for months.  Denies any inciting event.  She is left-handed.  This bothers her while she is typing.  She works at a desk all day.  Denies any history of compression of her forearm.  Has not tried any new or different exercises or equipment.  She denies any significant pain or numbness.  Her symptoms have remained the same.    Review of Systems  Constitutional: Negative for fever.  HENT: Negative for congestion.   Respiratory: Negative for cough.   Cardiovascular: Negative for chest pain.  Gastrointestinal: Negative for abdominal pain.  Musculoskeletal: Negative for gait problem.  Skin: Negative for color change.  Neurological: Positive for weakness.  Hematological: Negative for adenopathy.  Psychiatric/Behavioral: Negative for agitation.    HISTORY: Past Medical, Surgical, Social, and Family History Reviewed & Updated per EMR.   Pertinent Historical Findings include:  Past Medical History:  Diagnosis Date  . Heart murmur     Past Surgical History:  Procedure Laterality Date  . ABDOMINAL HYSTERECTOMY      No Known Allergies  Family History  Problem Relation Age of Onset  . Hypertension Father   . Asthma Daughter   . Asthma Son      Social  History   Socioeconomic History  . Marital status: Divorced    Spouse name: Not on file  . Number of children: Not on file  . Years of education: Not on file  . Highest education level: Not on file  Occupational History  . Not on file  Social Needs  . Financial resource strain: Not on file  . Food insecurity:    Worry: Not on file    Inability: Not on file  . Transportation needs:    Medical: Not on file    Non-medical: Not on file  Tobacco Use  . Smoking status: Never Smoker  Substance and Sexual Activity  . Alcohol use: Yes  . Drug use: No  . Sexual activity: Yes    Partners: Male    Birth control/protection: None  Lifestyle  . Physical activity:    Days per week: Not on file    Minutes per session: Not on file  . Stress: Not on file  Relationships  . Social connections:    Talks on phone: Not on file    Gets together: Not on file    Attends religious service: Not on file    Active member of club or organization: Not on file    Attends meetings of clubs or organizations: Not on file    Relationship status: Not on file  . Intimate partner violence:    Fear of current or ex partner: Not on file  Emotionally abused: Not on file    Physically abused: Not on file    Forced sexual activity: Not on file  Other Topics Concern  . Not on file  Social History Narrative  . Not on file     PHYSICAL EXAM:  VS: BP (!) 180/118   Pulse 78   Resp 16   Wt 158 lb (71.7 kg)   SpO2 98%   BMI 27.99 kg/m  Physical Exam Gen: NAD, alert, cooperative with exam, well-appearing ENT: normal lips, normal nasal mucosa,  Eye: normal EOM, normal conjunctiva and lids CV:  no edema, +2 pedal pulses   Resp: no accessory muscle use, non-labored,  Skin: no rashes, no areas of induration  Neuro: normal tone, normal sensation to touch Psych:  normal insight, alert and oriented MSK:  Right shoulder: Inspection reveals no abnormalities, atrophy or asymmetry. Palpation is normal with  no tenderness over AC joint  ROM is full in all planes. Rotator cuff strength normal throughout. Normal empty can sign. Pain with Hawkin's testing  Speeds and Yergason's tests normal. Pain with Obrien's Left thumb:  Unable to flex the IP joint  Normal flexion of the MP and CMC joint  Normal extension and opposition  No signs of atrophy  No areas that are TTP    Limited ultrasound: right shoulder/left thumb:  Right shoulder:  BT with an encircling effusion.  The tendon itself appears to be normal.  With no thickening or mucus changes within the tendon. Normal-appearing subscapularis and supraspinatus Posterior glenohumeral joint has suggestion of a small effusion  Left thumb: Normal-appearing IP joint. With passive dynamic testing the flexor tendon appears to not be activated  Summary: Findings suggest a biceps tendon tendinitis or an intra-articular process.  Structurally the left thumb is normal.  Ultrasound and interpretation by Clearance Coots, MD     ASSESSMENT & PLAN:   Acute pain of right shoulder Has an effusion of the biceps tendon.  Does not do any repetitive behavior.  She is left-handed.  Could be an intra-articular process but no trauma.  Could be degenerative. -Prednisone. -X-ray. -If no improvement will consider intra-articular injection.  Anterior interosseous nerve syndrome Does not have the ability to flex the distal phalanx of the thumb.  Likely related to a AI nerve.  Denies any trauma to the area.  Does not do any repetitive motions.  Just started all of a sudden. -Prednisone. -May need to be placed in a splint. -If no improvement will consider nerve conduction studies.

## 2018-07-18 NOTE — Patient Instructions (Signed)
Nice to meet you  Please try the medicine  Please let me know if your thumb doesn't get better after the medicine  Please follow up with me in 3-4 weeks if your shoulder pain doesn't improve.

## 2018-07-18 NOTE — Assessment & Plan Note (Addendum)
Has an effusion of the biceps tendon.  Does not do any repetitive behavior.  She is left-handed.  Could be an intra-articular process but no trauma.  Could be degenerative. -Prednisone. -X-ray. -If no improvement will consider intra-articular injection.

## 2018-07-21 NOTE — Assessment & Plan Note (Signed)
Does not have the ability to flex the distal phalanx of the thumb.  Likely related to a AI nerve.  Denies any trauma to the area.  Does not do any repetitive motions.  Just started all of a sudden. -Prednisone. -May need to be placed in a splint. -If no improvement will consider nerve conduction studies.

## 2018-08-15 ENCOUNTER — Ambulatory Visit: Payer: BLUE CROSS/BLUE SHIELD | Admitting: Family Medicine

## 2018-08-15 ENCOUNTER — Ambulatory Visit (INDEPENDENT_AMBULATORY_CARE_PROVIDER_SITE_OTHER)
Admission: RE | Admit: 2018-08-15 | Discharge: 2018-08-15 | Disposition: A | Discharge status: Home / Self Care | Payer: BLUE CROSS/BLUE SHIELD | Source: Ambulatory Visit | Attending: Family Medicine | Admitting: Family Medicine

## 2018-08-15 VITALS — BP 176/110 | HR 79 | Resp 16 | Ht 63.0 in

## 2018-08-15 DIAGNOSIS — M79671 Pain in right foot: Secondary | ICD-10-CM | POA: Diagnosis not present

## 2018-08-15 DIAGNOSIS — Z23 Encounter for immunization: Secondary | ICD-10-CM

## 2018-08-15 DIAGNOSIS — S91114A Laceration without foreign body of right lesser toe(s) without damage to nail, initial encounter: Secondary | ICD-10-CM | POA: Diagnosis not present

## 2018-08-15 NOTE — Patient Instructions (Signed)
Good to see you  Please leave the bandage in place for 24 hours.  Please try to keep the area clean and dry  Please see [place] back in 8-10 days.  Please try to place an antibiotic ointment on the area.

## 2018-08-15 NOTE — Progress Notes (Signed)
Haley Moreno - 53 y.o. female MRN [id number]  Date of birth: 14-Feb-1966  SUBJECTIVE:  Including CC & ROS.  No chief complaint on file.   Haley Moreno is a 53 y.o. female that is  Presenting with laceration under the third digit on her right foot. She was walking in the middle of night and knocked over a wine glass. She then stepped on the glass causing the cut. Having significant pain with walking. Not up to date with tetanus. She placed a wrap on the area to stop the bleeding. Denies any numbness or tingling.    Review of Systems  Constitutional: Negative for fever.  HENT: Negative for congestion.   Respiratory: Negative for cough.   Cardiovascular: Negative for chest pain.  Gastrointestinal: Negative for abdominal pain.  Musculoskeletal: Positive for gait problem.  Skin: Positive for wound.  Neurological: Negative for weakness.  Hematological: Negative for adenopathy.  Psychiatric/Behavioral: Negative for agitation.    HISTORY: Past Medical, Surgical, Social, and Family History Reviewed & Updated per EMR.   Pertinent Historical Findings include:  Past Medical History:  Diagnosis Date  . Heart murmur     Past Surgical History:  Procedure Laterality Date  . ABDOMINAL HYSTERECTOMY      No Known Allergies  Family History  Problem Relation Age of Onset  . Hypertension Father   . Asthma Daughter   . Asthma Son      Social History   Socioeconomic History  . Marital status: Divorced    Spouse name: Not on file  . Number of children: Not on file  . Years of education: Not on file  . Highest education level: Not on file  Occupational History  . Not on file  Social Needs  . Financial resource strain: Not on file  . Food insecurity:    Worry: Not on file    Inability: Not on file  . Transportation needs:    Medical: Not on file    Non-medical: Not on file  Tobacco Use  . Smoking status: Never Smoker  Substance and Sexual Activity  . Alcohol use:  Yes  . Drug use: No  . Sexual activity: Yes    Partners: Male    Birth control/protection: None  Lifestyle  . Physical activity:    Days per week: Not on file    Minutes per session: Not on file  . Stress: Not on file  Relationships  . Social connections:    Talks on phone: Not on file    Gets together: Not on file    Attends religious service: Not on file    Active member of club or organization: Not on file    Attends meetings of clubs or organizations: Not on file    Relationship status: Not on file  . Intimate partner violence:    Fear of current or ex partner: Not on file    Emotionally abused: Not on file    Physically abused: Not on file    Forced sexual activity: Not on file  Other Topics Concern  . Not on file  Social History Narrative  . Not on file     PHYSICAL EXAM:  VS: BP (!) 176/110   Pulse 79   Resp 16   Ht 5\' 3"  (1.6 m)   SpO2 98%   BMI 27.99 kg/m  Physical Exam Gen: NAD, alert, cooperative with exam, well-appearing ENT: normal lips, normal nasal mucosa,  Eye: normal EOM, normal conjunctiva and lids CV:  no edema, +2 pedal pulses   Resp: no accessory muscle use, non-labored,  Skin: laceration under the 3rd digit on plantar aspect in diagonal direction with no foreign body observed, roughly 5 mm in length  Neuro: normal tone, normal sensation to touch Psych:  normal insight, alert and oriented MSK: walking with gait    Laceration Procedure Note:   Procedure Name: Laceration Repair Indication: Reduce risk of infection Location: 3rd digit on right foot on plantar aspect  Pre-Procedure Diagnosis: Laceration Post-Procedure Diagnosis: Repaired Laceration Informed consent was obtained before procedure started. PROCEDURE: The appropriate timeout was taken. The area was prepped and draped in the usual sterile fashion. Local anesthesia was achieved using 8 cc of  Lidocaine 1% without. The wound was copiously irrigated. 2 4-0 Nylon and 3 6-0 Nylon  interrupted sutures were placed.  Estimated blood loss was less than 0.5 mL. A dressing was applied to the area and anticipatory guidance, as well as standard post-procedure care, was explained. Return precautions are given. The patient tolerated the procedure well without complications. Follow-up visit set for suture removal and evaluation of the laceration.     ASSESSMENT & PLAN:   Right foot pain Laceration on plantar aspect of 3rd digit. No foreign body observed.  - xray  - tetanus  - post op shoe placed  - counseled on care - given indications to follow up.

## 2018-08-15 NOTE — Assessment & Plan Note (Signed)
Laceration on plantar aspect of 3rd digit. No foreign body observed.  - xray  - tetanus  - post op shoe placed  - counseled on care - given indications to follow up.

## 2018-08-19 ENCOUNTER — Telehealth: Payer: Self-pay | Admitting: Family Medicine

## 2018-08-19 NOTE — Telephone Encounter (Signed)
Left VM for patient. If she calls back please have her speak with a nurse/CMA and inform that the xray didn't shows a foreign body. The [facility] can report results to patient.   If any questions then please take the best time and phone number to call and I will try to call her back.   Rosemarie Ax, MD [facility] 08/19/2018, 10:12 AM

## 2018-08-25 ENCOUNTER — Ambulatory Visit: Payer: BLUE CROSS/BLUE SHIELD | Admitting: Family Medicine

## 2018-08-25 ENCOUNTER — Encounter: Payer: Self-pay | Admitting: Family Medicine

## 2018-08-25 VITALS — BP 152/100 | HR 87 | Temp 98.3°F | Ht 63.0 in

## 2018-08-25 DIAGNOSIS — M79671 Pain in right foot: Secondary | ICD-10-CM | POA: Diagnosis not present

## 2018-08-25 MED ORDER — KETOROLAC TROMETHAMINE 60 MG/2ML IM SOLN
60.0000 mg | Freq: Once | INTRAMUSCULAR | Status: AC
  Administered 2018-08-25: 60 mg via INTRAMUSCULAR

## 2018-08-25 NOTE — Assessment & Plan Note (Signed)
Wound appears to have closed.  She still has significant pain today.  Does not appear to be infected. - IM Toradol -Provided samples of Vimovo -She can continue the postop shoe.  May need to continue this for another week or so. -Given indications to follow-up.

## 2018-08-25 NOTE — Patient Instructions (Signed)
Good to see you  Please try to change the dressing at least once per day  You may need to use the post op shoe for another week or two for pain  Please let me know if your pain isn't improving.

## 2018-08-25 NOTE — Progress Notes (Signed)
Haley Moreno - 53 y.o. female MRN [id number]  Date of birth: 1966-07-16  SUBJECTIVE:  Including CC & ROS.  Chief Complaint  Patient presents with  . Foot Pain    right     Haley Moreno is a 53 y.o. female that is following up for suture removal.  She has been having significant pain once anesthesia wore off.  She has been changing the dressings on a daily basis.  Independent review of the right foot x-ray from 1/3 shows no foreign body.   Review of Systems  Constitutional: Negative for fever.  HENT: Negative for congestion.   Respiratory: Negative for cough.   Cardiovascular: Negative for chest pain.  Gastrointestinal: Negative for abdominal pain.  Musculoskeletal: Positive for gait problem.  Skin: Positive for wound.    HISTORY: Past Medical, Surgical, Social, and Family History Reviewed & Updated per EMR.   Pertinent Historical Findings include:  Past Medical History:  Diagnosis Date  . Heart murmur     Past Surgical History:  Procedure Laterality Date  . ABDOMINAL HYSTERECTOMY      No Known Allergies  Family History  Problem Relation Age of Onset  . Hypertension Father   . Asthma Daughter   . Asthma Son      Social History   Socioeconomic History  . Marital status: Divorced    Spouse name: Not on file  . Number of children: Not on file  . Years of education: Not on file  . Highest education level: Not on file  Occupational History  . Not on file  Social Needs  . Financial resource strain: Not on file  . Food insecurity:    Worry: Not on file    Inability: Not on file  . Transportation needs:    Medical: Not on file    Non-medical: Not on file  Tobacco Use  . Smoking status: Never Smoker  . Smokeless tobacco: Never Used  Substance and Sexual Activity  . Alcohol use: Yes  . Drug use: No  . Sexual activity: Yes    Partners: Male    Birth control/protection: None  Lifestyle  . Physical activity:    Days per week: Not on file   Minutes per session: Not on file  . Stress: Not on file  Relationships  . Social connections:    Talks on phone: Not on file    Gets together: Not on file    Attends religious service: Not on file    Active member of club or organization: Not on file    Attends meetings of clubs or organizations: Not on file    Relationship status: Not on file  . Intimate partner violence:    Fear of current or ex partner: Not on file    Emotionally abused: Not on file    Physically abused: Not on file    Forced sexual activity: Not on file  Other Topics Concern  . Not on file  Social History Narrative  . Not on file     PHYSICAL EXAM:  VS: BP (!) 152/100   Pulse 87   Temp 98.3 F (36.8 C) (Oral)   Ht 5\' 3"  (1.6 m)   SpO2 97%   BMI 27.99 kg/m  Physical Exam Gen: NAD, alert, cooperative with exam, well-appearing ENT: normal lips, normal nasal mucosa,  Eye: normal EOM, normal conjunctiva and lids CV:  no edema, +2 pedal pulses   Resp: no accessory muscle use, non-labored,  Skin: no rashes, no  areas of induration  Neuro: normal tone, normal sensation to touch Psych:  normal insight, alert and oriented MSK: Normal strength  Removal of the sutures was completed.  There is no dehiscence of the wound.  5 sutures were removed.  A dressing was applied.      ASSESSMENT & PLAN:   Right foot pain Wound appears to have closed.  She still has significant pain today.  Does not appear to be infected. - IM Toradol -Provided samples of Vimovo -She can continue the postop shoe.  May need to continue this for another week or so. -Given indications to follow-up.

## 2018-10-17 ENCOUNTER — Ambulatory Visit: Payer: BLUE CROSS/BLUE SHIELD | Admitting: Nurse Practitioner

## 2018-10-20 ENCOUNTER — Encounter: Payer: Self-pay | Admitting: Family

## 2018-10-20 ENCOUNTER — Ambulatory Visit: Payer: BLUE CROSS/BLUE SHIELD | Admitting: Family

## 2018-10-20 VITALS — BP 154/100 | HR 80 | Temp 97.7°F | Ht 63.0 in | Wt 167.0 lb

## 2018-10-20 DIAGNOSIS — M6530 Trigger finger, unspecified finger: Secondary | ICD-10-CM

## 2018-10-20 DIAGNOSIS — I10 Essential (primary) hypertension: Secondary | ICD-10-CM | POA: Diagnosis not present

## 2018-10-20 DIAGNOSIS — Z1211 Encounter for screening for malignant neoplasm of colon: Secondary | ICD-10-CM | POA: Diagnosis not present

## 2018-10-20 MED ORDER — AMLODIPINE BESYLATE 5 MG PO TABS: 5.0000 mg | ORAL_TABLET | Freq: Every day | ORAL | 1 refills | Status: DC

## 2018-10-20 NOTE — Progress Notes (Signed)
Haley Moreno is a 53 y.o. female with the following history as recorded in EpicCare:  Patient Active Problem List   Diagnosis Date Noted  . Right foot pain 08/15/2018  . Acute pain of right shoulder 07/18/2018  . 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  . Pre-diabetes 12/23/2013  . Essential hypertension, benign 12/23/2013    Current Outpatient Medications  Medication Sig Dispense Refill  . amLODipine (NORVASC) 5 MG tablet Take 1 tablet (5 mg total) by mouth daily. 30 tablet 1   No current facility-administered medications for this visit.     Allergies: Patient has no known allergies.  Past Medical History:  Diagnosis Date  . Heart murmur     Past Surgical History:  Procedure Laterality Date  . CARPAL TUNNEL RELEASE Bilateral   . OOPHORECTOMY     Patient unsure of side    Family History  Problem Relation Age of Onset  . Hypertension Father   . Asthma Daughter   . Asthma Son     Social History   Tobacco Use  . Smoking status: Never Smoker  . Smokeless tobacco: Never Used  Substance Use Topics  . Alcohol use: Yes    Subjective:  Patient presents today to establish primary care needs; history of hypertension- not currently taking any medication; Denies any chest pain, shortness of breath, blurred vision or headache.  Sees Dr. Garwin Brothers for GYN needs; is up to date on pap smear/ mammogram; per patient, she had her labs done through her GYN in the past year;   Overdue for colonoscopy; agreeable to scheduling;     Objective:  Vitals:   10/20/18 1602  BP: (!) 154/100  Pulse: 80  Temp: 97.7 F (36.5 C)  TempSrc: Oral  SpO2: 98%  Weight: 167 lb 0.6 oz (75.8 kg)  Height: 5\' 3"  (1.6 m)    General: Well developed, well nourished, in no acute distress  Skin : Warm and dry.  Head: Normocephalic and atraumatic  Eyes: Sclera and conjunctiva clear; pupils round and reactive to light; extraocular movements intact  Ears: External normal; canals clear;  tympanic membranes normal  Oropharynx: Pink, supple. No suspicious lesions  Neck: Supple without thyromegaly, adenopathy  Lungs: Respirations unlabored; clear to auscultation bilaterally without wheeze, rales, rhonchi  CVS exam: normal rate and regular rhythm.  Neurologic: Alert and oriented; speech intact; face symmetrical; moves all extremities well; CNII-XII intact without focal deficit    Assessment:  1. Essential hypertension, benign   2. Encounter for screening colonoscopy   3. Trigger finger, unspecified finger, unspecified laterality     Plan:  1. Start Amlodipine 5 mg daily; follow-up in 4-6 weeks; 2. Referral for colonoscopy; 3. Referral to orthopedics;  Need to get copy of pap smear, mammogram and recent labs done at [facility] from Dr. Garwin Brothers.   Return in about 1 month (around 11/20/2018).  Orders Placed This Encounter  Procedures  . Ambulatory referral to Gastroenterology    Referral Priority:   Routine    Referral Type:   Consultation    Referral Reason:   Specialty Services Required    Number of Visits Requested:   1  . Ambulatory referral to Orthopedic Surgery    Referral Priority:   Routine    Referral Type:   Surgical    Referral Reason:   Specialty Services Required    Requested Specialty:   Orthopedic Surgery    Number of Visits Requested:   1    Requested Prescriptions   Signed Prescriptions  Disp Refills  . amLODipine (NORVASC) 5 MG tablet 30 tablet 1    Sig: Take 1 tablet (5 mg total) by mouth daily.

## 2018-11-04 ENCOUNTER — Encounter: Payer: Self-pay | Admitting: Family

## 2018-11-24 ENCOUNTER — Ambulatory Visit: Payer: BLUE CROSS/BLUE SHIELD | Admitting: Family

## 2019-02-24 ENCOUNTER — Ambulatory Visit (AMBULATORY_SURGERY_CENTER): Payer: Self-pay | Admitting: *Deleted

## 2019-02-24 ENCOUNTER — Other Ambulatory Visit: Payer: Self-pay

## 2019-02-24 VITALS — Ht 62.0 in | Wt 165.0 lb

## 2019-02-24 DIAGNOSIS — Z1211 Encounter for screening for malignant neoplasm of colon: Secondary | ICD-10-CM

## 2019-02-24 MED ORDER — PLENVU 140 G PO SOLR: 1.0000 | ORAL | 0 refills | Status: DC

## 2019-02-24 NOTE — Progress Notes (Signed)
No egg or soy allergy known to patient  No issues with past sedation with any surgeries  or procedures, no intubation problems  diet pills per patient - pt on Phentermine- last taken SAT 7-11 No home 02 use per patient  No blood thinners per patient  Pt denies issues with constipation  No A fib or A flutter  EMMI video sent to pt's e mail  Instructions sent via e mail as well due to procedure so close   Pt verified name, DOB, address and insurance during PV today. Pt mailed instruction packet to included paper to complete and mail back to [facility] with addressed and stamped envelope, Emmi video, copy of consent form to read and not return, and instructions. Pl[employer] coupon mailed in packet. PV completed over the phone. Pt encouraged to call with questions or issues   Pt is aware that care partner will wait in the car during procedure; if they feel like they will be too hot to wait in the car; they may wait in the lobby.  We want them to wear a mask (we do not have any that we can provide them), practice social distancing, and we will check their temperatures when they get here.  I did remind patient that their care partner needs to stay in the parking lot the entire time. Pt will wear mask into building.

## 2019-02-25 ENCOUNTER — Ambulatory Visit: Payer: BLUE CROSS/BLUE SHIELD | Admitting: Orthopaedic Surgery

## 2019-03-02 ENCOUNTER — Telehealth: Payer: Self-pay | Admitting: Gastroenterology

## 2019-03-02 NOTE — Telephone Encounter (Signed)
Pt is scheduled for colonoscopy tomorrow and has questions regarding her prep instructions.

## 2019-03-02 NOTE — Telephone Encounter (Signed)
Pt went to have her RX filled and was unable to use the coupon given to her for prep.  She states that her pharmacy said they needed some information from her physician before they could honor the discount.  I advised her to call the pharmacy to ask what information they need before coming in for her procedure tomorrow.

## 2019-03-02 NOTE — Telephone Encounter (Signed)

## 2019-03-02 NOTE — Telephone Encounter (Signed)
No to all answers. °

## 2019-03-03 ENCOUNTER — Encounter: Payer: Self-pay | Admitting: Gastroenterology

## 2019-03-03 ENCOUNTER — Ambulatory Visit (AMBULATORY_SURGERY_CENTER): Payer: BC Managed Care – PPO | Admitting: Gastroenterology

## 2019-03-03 ENCOUNTER — Other Ambulatory Visit: Payer: Self-pay

## 2019-03-03 VITALS — BP 137/92 | HR 89 | Temp 98.8°F | Resp 15 | Ht 63.0 in | Wt 167.0 lb

## 2019-03-03 DIAGNOSIS — D125 Benign neoplasm of sigmoid colon: Secondary | ICD-10-CM | POA: Diagnosis not present

## 2019-03-03 DIAGNOSIS — Z1211 Encounter for screening for malignant neoplasm of colon: Secondary | ICD-10-CM

## 2019-03-03 MED ORDER — SODIUM CHLORIDE 0.9 % IV SOLN: 500.0000 mL | Freq: Once | INTRAVENOUS | Status: DC

## 2019-03-03 NOTE — Progress Notes (Signed)
Report given to PACU, vss 

## 2019-03-03 NOTE — Progress Notes (Signed)
Pt's states no medical or surgical changes since previsit or office visit. 

## 2019-03-03 NOTE — Op Note (Signed)
[facility] Patient Name: Haley Moreno Procedure Date: 03/03/2019 9:25 AM MRN: [id number] Endoscopist: Quinlan. Loletha Carrow , MD Age: 53 Referring MD:  Date of Birth: 1966-03-14 Gender: Female Account #: [id number] Procedure:                Colonoscopy Indications:              Screening for colorectal malignant neoplasm, This                            is the patient's first colonoscopy Medicines:                Monitored Anesthesia Care Procedure:                Pre-Anesthesia Assessment:                           - Prior to the procedure, a History and Physical                            was performed, and patient medications and                            allergies were reviewed. The patient's tolerance of                            previous anesthesia was also reviewed. The risks                            and benefits of the procedure and the sedation                            options and risks were discussed with the patient.                            All questions were answered, and informed consent                            was obtained. Prior Anticoagulants: The patient has                            taken no previous anticoagulant or antiplatelet                            agents. ASA Grade Assessment: II - A patient with                            mild systemic disease. After reviewing the risks                            and benefits, the patient was deemed in                            satisfactory condition to undergo the procedure.  After obtaining informed consent, the colonoscope                            was passed under direct vision. Throughout the                            procedure, the patient's blood pressure, pulse, and                            oxygen saturations were monitored continuously. The                            Colonoscope was introduced through the anus and                            advanced to the the  cecum, identified by                            appendiceal orifice and ileocecal valve. The                            colonoscopy was performed without difficulty. The                            patient tolerated the procedure well. The quality                            of the bowel preparation was good. The ileocecal                            valve, appendiceal orifice, and rectum were                            photographed. Scope In: 9:32:18 AM Scope Out: 9:55:07 AM Scope Withdrawal Time: 0 hours 18 minutes 5 seconds  Total Procedure Duration: 0 hours 22 minutes 49 seconds  Findings:                 The perianal and digital rectal examinations were                            normal.                           A diminutive polyp was found in the sigmoid colon.                            The polyp was sessile. The polyp was removed with a                            cold snare. Resection and retrieval were complete.                           Retroflexion in the rectum was not performed due to  narrow anatomy.                           The exam was otherwise without abnormality. Complications:            No immediate complications. Estimated Blood Loss:     Estimated blood loss was minimal. Impression:               - One diminutive polyp in the sigmoid colon,                            removed with a cold snare. Resected and retrieved.                           - The examination was otherwise normal. Recommendation:           - Patient has a contact number available for                            emergencies. The signs and symptoms of potential                            delayed complications were discussed with the                            patient. Return to normal activities tomorrow.                            Written discharge instructions were provided to the                            patient.                           - Resume previous diet.                            - Continue present medications.                           - Await pathology results.                           - Repeat colonoscopy is recommended for                            surveillance. The colonoscopy date will be                            determined after pathology results from today's                            exam become available for review. Henry L. Loletha Carrow, MD 03/03/2019 9:59:05 AM This report has been signed electronically.

## 2019-03-03 NOTE — Patient Instructions (Signed)
YOU HAD AN ENDOSCOPIC PROCEDURE TODAY AT THE [facility]:   Refer to the procedure report that was given to you for any specific questions about what was found during the examination.  If the procedure report does not answer your questions, please call your gastroenterologist to clarify.  If you requested that your care partner not be given the details of your procedure findings, then the procedure report has been included in a sealed envelope for you to review at your convenience later.  **Handout given on polyps**   YOU SHOULD EXPECT: Some feelings of bloating in the abdomen. Passage of more gas than usual.  Walking can help get rid of the air that was put into your GI tract during the procedure and reduce the bloating. If you had a lower endoscopy (such as a colonoscopy or flexible sigmoidoscopy) you may notice spotting of blood in your stool or on the toilet paper. If you underwent a bowel prep for your procedure, you may not have a normal bowel movement for a few days.  Please Note:  You might notice some irritation and congestion in your nose or some drainage.  This is from the oxygen used during your procedure.  There is no need for concern and it should clear up in a day or so.  SYMPTOMS TO REPORT IMMEDIATELY:   Following lower endoscopy (colonoscopy or flexible sigmoidoscopy):  Excessive amounts of blood in the stool  Significant tenderness or worsening of abdominal pains  Swelling of the abdomen that is new, acute  Fever of 100F or higher   For urgent or emergent issues, a gastroenterologist can be reached at any hour by calling [phone].   DIET:  We do recommend a small meal at first, but then you may proceed to your regular diet.  Drink plenty of fluids but you should avoid alcoholic beverages for 24 hours.  ACTIVITY:  You should plan to take it easy for the rest of today and you should NOT DRIVE or use heavy machinery until tomorrow (because of the sedation  medicines used during the test).    FOLLOW UP: Our staff will call the number listed on your records 48-72 hours following your procedure to check on you and address any questions or concerns that you may have regarding the information given to you following your procedure. If we do not reach you, we will leave a message.  We will attempt to reach you two times.  During this call, we will ask if you have developed any symptoms of COVID 19. If you develop any symptoms (ie: fever, flu-like symptoms, shortness of breath, cough etc.) before then, please call [phone].  If you test positive for Covid 19 in the 2 weeks post procedure, please call and report this information to us.    If any biopsies were taken you will be contacted by phone or by letter within the next 1-3 weeks.  Please call us at [phone] if you have not heard about the biopsies in 3 weeks.    SIGNATURES/CONFIDENTIALITY: You and/or your care partner have signed paperwork which will be entered into your electronic medical record.  These signatures attest to the fact that that the information above on your After Visit Summary has been reviewed and is understood.  Full responsibility of the confidentiality of this discharge information lies with you and/or your care-partner. 

## 2019-03-03 NOTE — Progress Notes (Signed)
Called to room to assist during endoscopic procedure.  Patient ID and intended procedure confirmed with present staff. Received instructions for my participation in the procedure from the performing physician.  

## 2019-03-05 ENCOUNTER — Telehealth: Payer: Self-pay

## 2019-03-05 NOTE — Telephone Encounter (Signed)
  Follow up Call-  Call back number 03/03/2019  Post procedure Call Back phone  # [phone]  Permission to leave phone message Yes  Some recent data might be hidden     Patient questions:  Do you have a fever, pain , or abdominal swelling? No. Pain Score  0 *  Have you tolerated food without any problems? Yes.    Have you been able to return to your normal activities? Yes.    Do you have any questions about your discharge instructions: Diet   No. Medications  No. Follow up visit  No.  Do you have questions or concerns about your Care? No.  Actions: * If pain score is 4 or above: No action needed, pain <4.  1. Have you developed a fever since your procedure? no  2.   Have you had an respiratory symptoms (SOB or cough) since your procedure? no  3.   Have you tested positive for COVID 19 since your procedure no  4.   Have you had any family members/close contacts diagnosed with the COVID 19 since your procedure?  no   If yes to any of these questions please route to Joylene John, RN and Alphonsa Gin, Therapist, sports.

## 2019-03-09 ENCOUNTER — Encounter: Payer: Self-pay | Admitting: Gastroenterology

## 2019-03-11 ENCOUNTER — Other Ambulatory Visit: Payer: Self-pay

## 2019-03-11 ENCOUNTER — Ambulatory Visit: Payer: Self-pay

## 2019-03-11 ENCOUNTER — Ambulatory Visit (INDEPENDENT_AMBULATORY_CARE_PROVIDER_SITE_OTHER): Payer: BC Managed Care – PPO | Admitting: Orthopaedic Surgery

## 2019-03-11 ENCOUNTER — Encounter: Payer: Self-pay | Admitting: Physician Assistant

## 2019-03-11 DIAGNOSIS — M5441 Lumbago with sciatica, right side: Secondary | ICD-10-CM

## 2019-03-11 DIAGNOSIS — M65312 Trigger thumb, left thumb: Secondary | ICD-10-CM | POA: Diagnosis not present

## 2019-03-11 MED ORDER — PREDNISONE 10 MG (21) PO TBPK: ORAL_TABLET | ORAL | 0 refills | Status: DC

## 2019-03-11 MED ORDER — METHOCARBAMOL 500 MG PO TABS: 500.0000 mg | ORAL_TABLET | Freq: Two times a day (BID) | ORAL | 0 refills | Status: DC | PRN

## 2019-03-11 NOTE — Progress Notes (Signed)
Office Visit Note   Patient: Haley Moreno           Date of Birth: [birth year]           MRN: [id number] Visit Date: 03/11/2019              Requested by: Marrian Salvage, [place],  [place] [address] PCP: Marrian Salvage, FNP   Assessment & Plan: Visit Diagnoses:  1. Acute right-sided low back pain with right-sided sciatica   2. Trigger thumb of left hand     Plan: Impression is #1 left trigger thumb.  #2 right-sided lumbar radiculopathy with L5 retrolisthesis.  In regards to the film, we injected this with cortisone today.  I did discuss surgical intervention should cortisone injections fail to relieve her symptoms.  In regards to her back, I have called in a Sterapred taper, muscle relaxer as well as provided the patient with a physical therapy prescription.  If she is not any better in the next few months she will follow back up with [place].  Call with concerns or questions in meantime.  Follow-Up Instructions: Return if symptoms worsen or fail to improve.   Orders:  Orders Placed This Encounter  Procedures  . Hand/UE Inj: L thumb A1  . XR Lumbar Spine 2-3 Views   Meds ordered this encounter  Medications  . predniSONE (STERAPRED UNI-PAK 21 TAB) 10 MG (21) TBPK tablet    Sig: Take as directed    Dispense:  21 tablet    Refill:  0  . methocarbamol (ROBAXIN) 500 MG tablet    Sig: Take 1 tablet (500 mg total) by mouth 2 (two) times daily as needed for muscle spasms.    Dispense:  20 tablet    Refill:  0      Procedures: Hand/UE Inj: L thumb A1 for trigger finger on 03/11/2019 2:47 PM Indications: pain Details: 25 G needle Medications: 0.33 mL bupivacaine 0.25 %; 1 mL lidocaine 1 %; 13.33 mg methylPREDNISolone acetate 40 MG/ML      Clinical Data: No additional findings.   Subjective: Chief Complaint  Patient presents with  . Left Thumb - Pain  . Lower Back - Pain    HPI patient is a pleasant 53 year old female who presents  to our clinic today with left thumb and right lower back pain.  Regards to her left thumb, she has noticed pain and triggering for the past 4 months.  No known injury or change in activity.  Triggering has worsened.  She is not a diabetic.  In regards to her right lower back, she has had pain here for several months as she has been working from home and sitting for long periods of time..  Her pain is started to radiate down into her leg and into her foot.  She has increased pain when she is sitting for long period of time or when she is laying in bed.  She does note occasional numbness to her right lower leg.  She has been wearing compression socks which seems to help her lower leg pain.  No previous history of lumbar pathology.  No bowel or bladder change and no saddle paresthesias.  Review of Systems as detailed in HPI.  All others reviewed and are negative.   Objective: Vital Signs: There were no vitals taken for this visit.  Physical Exam well-developed well-nourished female no acute distress.  Alert and oriented x3.  Ortho Exam examination of her left  thumb reveals a tender and palpable nodule at the A1 pulley.  She does have active triggering.  Negative CMC grind test.  Full range of motion.  She is neurovascular intact distally.  Examination of her lumbar spine reveals no spinous or paraspinous tenderness.  Increased pain with lumbar extension.  Moderately positive straight leg raise.  No focal weakness.  She is neurovascularly intact distally.  Specialty Comments:  No specialty comments available.  Imaging: Xr Lumbar Spine 2-3 Views  Result Date: 03/11/2019 X-rays demonstrate abnormal straightening of the lumbar spine with retrolisthesis at L5.    PMFS History: Patient Active Problem List   Diagnosis Date Noted  . Right foot pain 08/15/2018  . Acute pain of right shoulder 07/18/2018  . 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  . Pre-diabetes 12/23/2013  . Essential  hypertension, benign 12/23/2013   Past Medical History:  Diagnosis Date  . Allergy    past hx - used to have allergy shots   . Cataract    both eyes - forming   . Heart murmur   . Hypertension     Family History  Problem Relation Age of Onset  . Hypertension Father   . Asthma Daughter   . Asthma Son   . Colon cancer Neg Hx   . Colon polyps Neg Hx   . Esophageal cancer Neg Hx   . Rectal cancer Neg Hx   . Stomach cancer Neg Hx     Past Surgical History:  Procedure Laterality Date  . CARPAL TUNNEL RELEASE Bilateral   . OOPHORECTOMY     Patient unsure of side   Social History   Occupational History  . Not on file  Tobacco Use  . Smoking status: Never Smoker  . Smokeless tobacco: Never Used  Substance and Sexual Activity  . Alcohol use: Yes    Comment: wine- every other day   . Drug use: No  . Sexual activity: Yes    Partners: Male    Birth control/protection: None

## 2019-03-12 MED ORDER — METHYLPREDNISOLONE ACETATE 40 MG/ML IJ SUSP
13.3300 mg | INTRAMUSCULAR | Status: AC | PRN
  Administered 2019-03-11: 13.33 mg

## 2019-03-12 MED ORDER — BUPIVACAINE HCL 0.25 % IJ SOLN
0.3300 mL | INTRAMUSCULAR | Status: AC | PRN
  Administered 2019-03-11: .33 mL

## 2019-03-12 MED ORDER — LIDOCAINE HCL 1 % IJ SOLN
1.0000 mL | INTRAMUSCULAR | Status: AC | PRN
  Administered 2019-03-11: 1 mL

## 2019-03-17 ENCOUNTER — Other Ambulatory Visit: Payer: Self-pay | Admitting: Family

## 2019-05-10 IMAGING — DX DG FOOT COMPLETE 3+V*R*
3 series · 3 of 3 positions shown · non-contrast
Comparison: None.

CLINICAL DATA: Stepped on glass and laceration to the third toe.
Right foot pain.

EXAM:
RIGHT FOOT COMPLETE - 3+ VIEW

[foot ap]
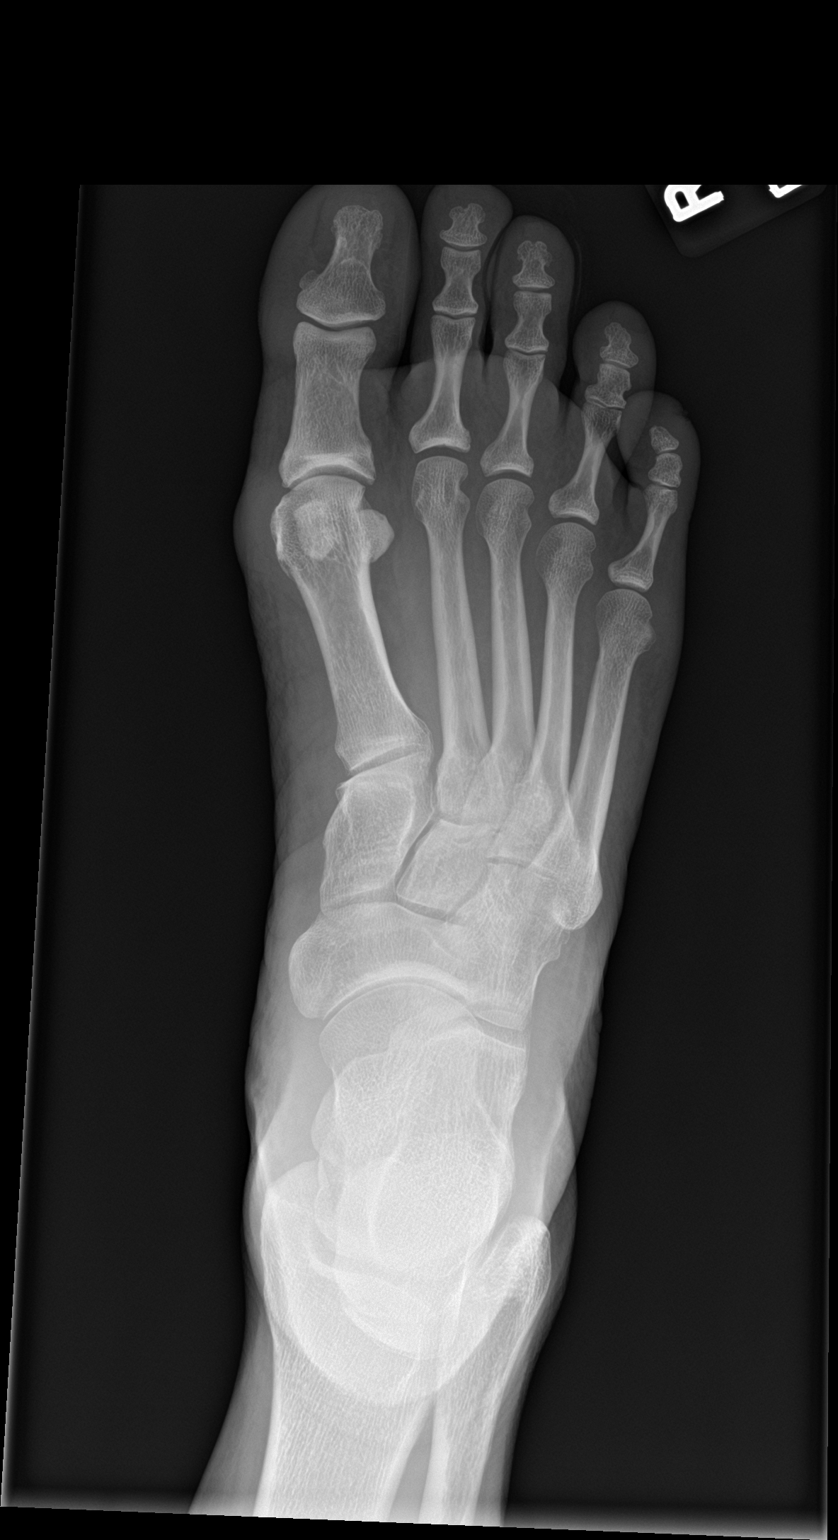

[foot obl]
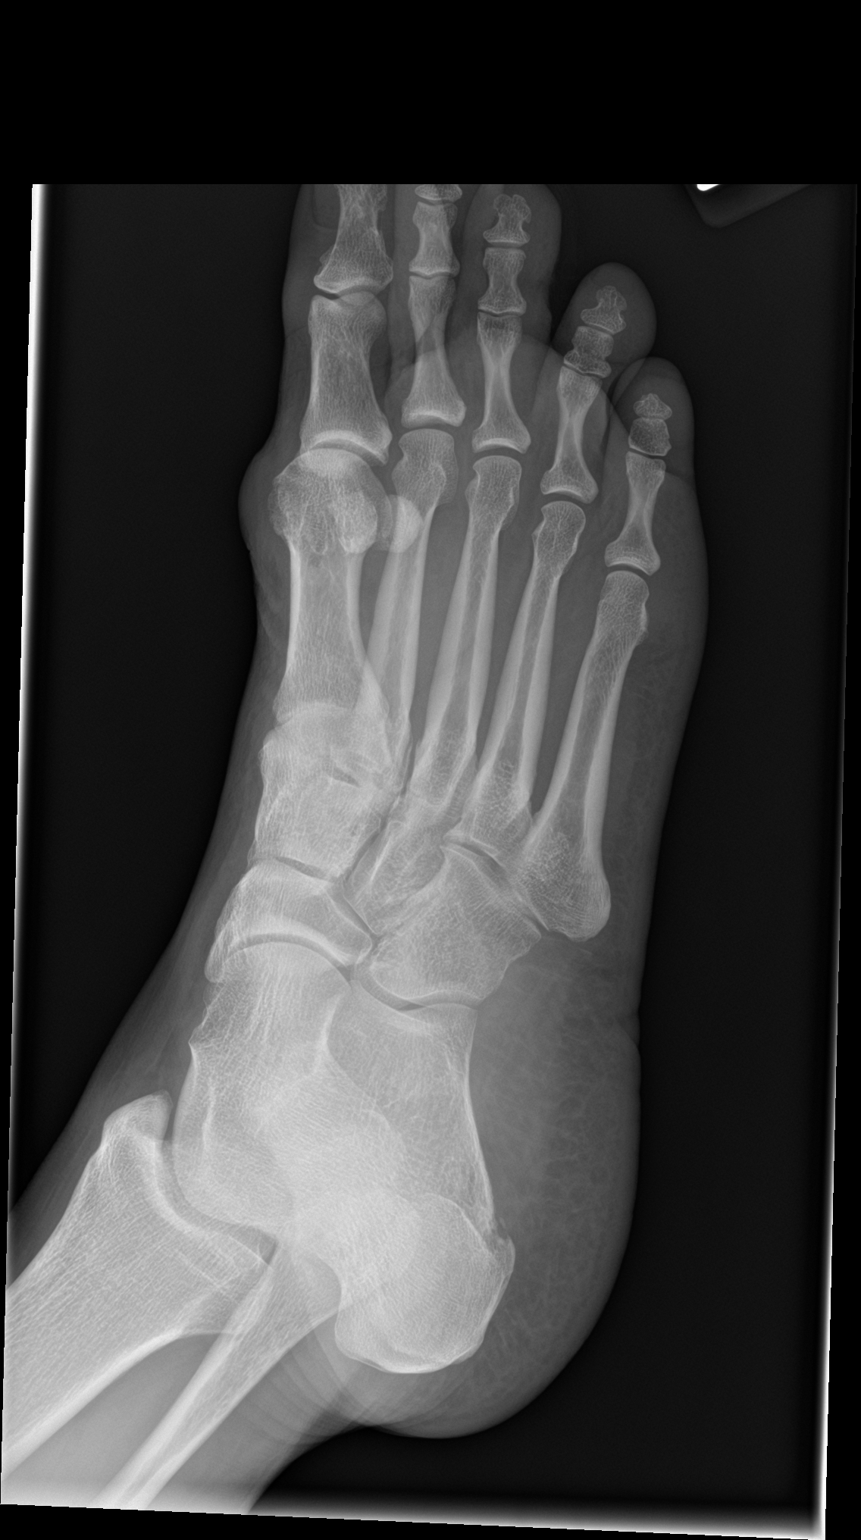

[foot lat]
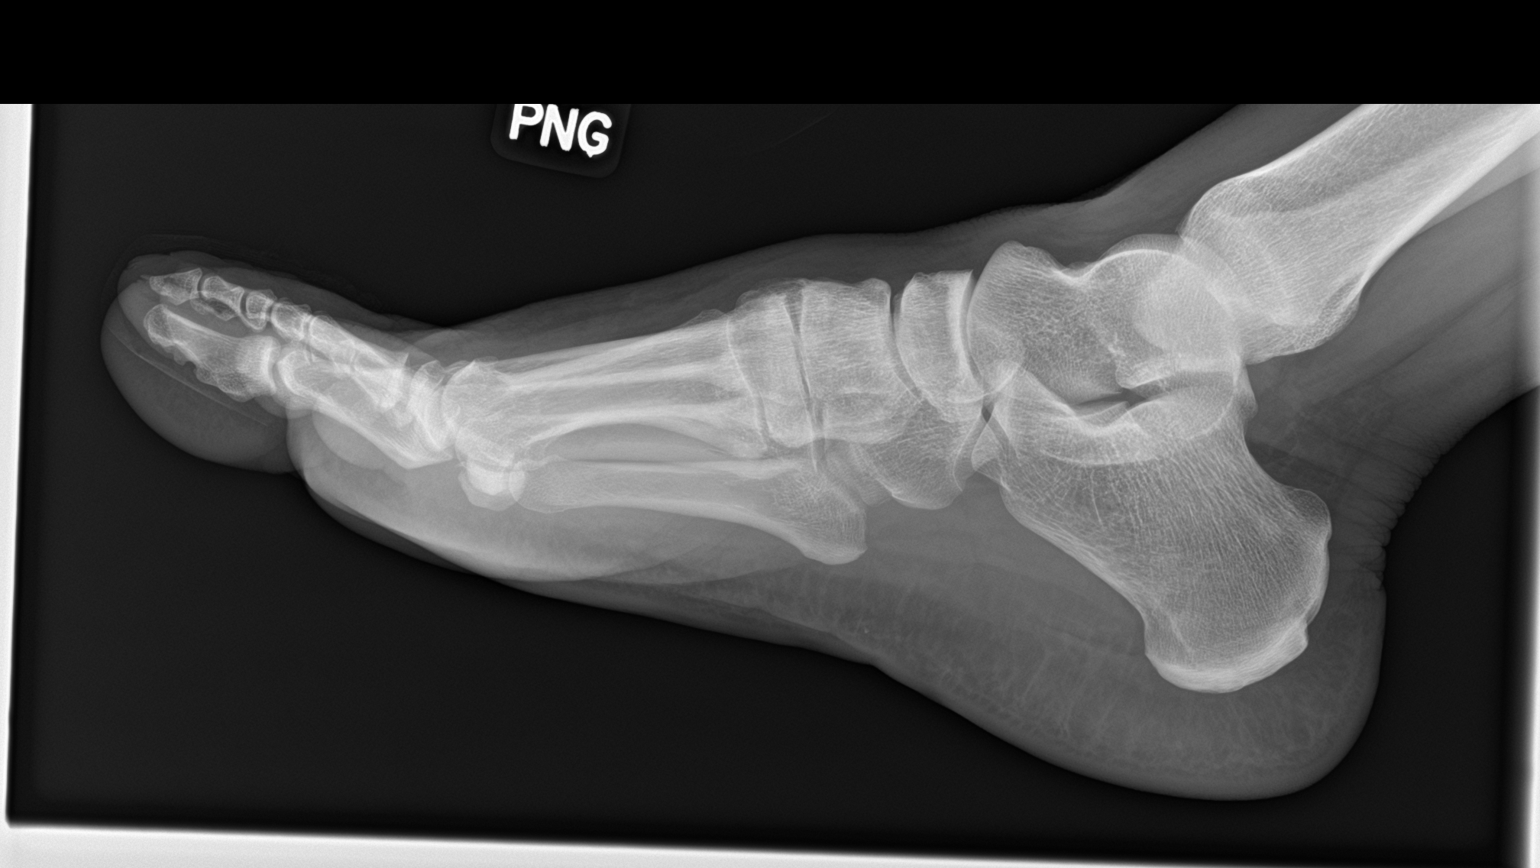

[3 of 3 positions shown; findings below may reference images not displayed]

FINDINGS: Negative for fracture or dislocation. Mild degenerative changes at
the first MTP joint. No evidence for a radiopaque foreign body.
Bandage overlying the second and third toes.
IMPRESSION: No acute bone abnormality to the right foot.

## 2019-11-12 ENCOUNTER — Ambulatory Visit: Payer: 59 | Attending: Internal Medicine

## 2019-11-12 DIAGNOSIS — Z23 Encounter for immunization: Secondary | ICD-10-CM

## 2019-11-12 NOTE — Progress Notes (Signed)
   Covid-19 Vaccination Clinic  Name:  Haley Moreno    MRN: [id number] DOB: May 18, 1966  11/12/2019  Haley Moreno was observed post Covid-19 immunization for 15 minutes without incident. She was provided with Vaccine Information Sheet and instruction to access the V-Safe system.   Haley Moreno was instructed to call 911 with any severe reactions post vaccine: Marland Kitchen Difficulty breathing  . Swelling of face and throat  . A fast heartbeat  . A bad rash all over body  . Dizziness and weakness   Immunizations Administered    Name Date Dose VIS Date Route   Pfizer COVID-19 Vaccine 11/12/2019  5:03 PM 0.3 mL 07/24/2019 Intramuscular   Manufacturer: [place]   Lot: [id number]   [place]: [id number]

## 2019-12-09 ENCOUNTER — Ambulatory Visit: Payer: 59 | Attending: Internal Medicine

## 2019-12-09 DIAGNOSIS — Z23 Encounter for immunization: Secondary | ICD-10-CM

## 2019-12-09 NOTE — Progress Notes (Signed)
   Covid-19 Vaccination Clinic  Name:  Haley Moreno    MRN: [id number] DOB: 08/28/1965  12/09/2019  [name] was observed post Covid-19 immunization for 15 minutes without incident. She was provided with Vaccine Information Sheet and instruction to access the V-Safe system.   [name] was instructed to call 911 with any severe reactions post vaccine: Marland Kitchen Difficulty breathing  . Swelling of face and throat  . A fast heartbeat  . A bad rash all over body  . Dizziness and weakness   Immunizations Administered    Name Date Dose VIS Date Route   Pfizer COVID-19 Vaccine 12/09/2019  4:38 PM 0.3 mL 10/07/2018 Intramuscular   Manufacturer: [place]   Lot: [id number]   [place]: [id number]

## 2020-05-04 ENCOUNTER — Ambulatory Visit (INDEPENDENT_AMBULATORY_CARE_PROVIDER_SITE_OTHER): Payer: No Typology Code available for payment source | Admitting: Family

## 2020-05-04 ENCOUNTER — Encounter: Payer: Self-pay | Admitting: Family

## 2020-05-04 ENCOUNTER — Other Ambulatory Visit: Payer: Self-pay

## 2020-05-04 VITALS — BP 140/90 | HR 85 | Temp 98.7°F | Ht 63.0 in | Wt 181.0 lb

## 2020-05-04 DIAGNOSIS — Z23 Encounter for immunization: Secondary | ICD-10-CM

## 2020-05-04 DIAGNOSIS — Z1231 Encounter for screening mammogram for malignant neoplasm of breast: Secondary | ICD-10-CM | POA: Diagnosis not present

## 2020-05-04 DIAGNOSIS — Z0000 Encounter for general adult medical examination without abnormal findings: Secondary | ICD-10-CM | POA: Diagnosis not present

## 2020-05-04 DIAGNOSIS — E669 Obesity, unspecified: Secondary | ICD-10-CM | POA: Diagnosis not present

## 2020-05-04 DIAGNOSIS — Z6832 Body mass index (BMI) 32.0-32.9, adult: Secondary | ICD-10-CM

## 2020-05-04 DIAGNOSIS — Z13220 Encounter for screening for lipoid disorders: Secondary | ICD-10-CM

## 2020-05-04 MED ORDER — AMLODIPINE BESYLATE 5 MG PO TABS: 5.0000 mg | ORAL_TABLET | Freq: Every day | ORAL | 0 refills | Status: DC

## 2020-05-04 NOTE — Progress Notes (Signed)
Haley Moreno is a 54 y.o. female with the following history as recorded in EpicCare:  Patient Active Problem List   Diagnosis Date Noted  . Right foot pain 08/15/2018  . Acute pain of right shoulder 07/18/2018  . 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  . Pre-diabetes 12/23/2013  . Essential hypertension, benign 12/23/2013    Current Outpatient Medications  Medication Sig Dispense Refill  . amLODipine (NORVASC) 5 MG tablet Take 1 tablet (5 mg total) by mouth daily. 30 tablet 0   No current facility-administered medications for this visit.    Allergies: Patient has no known allergies.  Past Medical History:  Diagnosis Date  . Allergy    past hx - used to have allergy shots   . Cataract    both eyes - forming   . Heart murmur   . Hypertension     Past Surgical History:  Procedure Laterality Date  . CARPAL TUNNEL RELEASE Bilateral   . OOPHORECTOMY     Patient unsure of side    Family History  Problem Relation Age of Onset  . Hypertension Father   . Asthma Daughter   . Asthma Son   . Colon cancer Neg Hx   . Colon polyps Neg Hx   . Esophageal cancer Neg Hx   . Rectal cancer Neg Hx   . Stomach cancer Neg Hx     Social History   Tobacco Use  . Smoking status: Never Smoker  . Smokeless tobacco: Never Used  Substance Use Topics  . Alcohol use: Yes    Comment: wine- every other day     Subjective:   Presents for yearly CPE; has not been seen here since March 2020- at that visit she was given blood pressure medication but did not ever follow-up; not on her blood pressure medication today; Overdue to see her GYN- H. Rivera Colon; thinks she last saw her GYN in 2019;  Worried about weight gain- admits has not been eating right/ exercising due to COVID;  Review of Systems  Constitutional: Negative.   HENT: Negative.   Eyes: Negative.   Respiratory: Negative.   Cardiovascular: Negative.   Gastrointestinal: Negative.   Genitourinary: Negative.    Musculoskeletal: Negative.   Skin: Negative.   Neurological: Negative.   Endo/Heme/Allergies: Negative.   Psychiatric/Behavioral: Negative.      Objective:  Vitals:   05/04/20 1121  BP: 140/90  Pulse: 85  Temp: 98.7 F (37.1 C)  TempSrc: Oral  SpO2: 96%  Weight: 181 lb (82.1 kg)  Height: 5' 3"  (1.6 m)    General: Well developed, well nourished, in no acute distress  Skin : Warm and dry.  Head: Normocephalic and atraumatic  Eyes: Sclera and conjunctiva clear; pupils round and reactive to light; extraocular movements intact  Ears: External normal; canals clear; tympanic membranes normal  Oropharynx: Pink, supple. No suspicious lesions  Neck: Supple without thyromegaly, adenopathy  Lungs: Respirations unlabored; clear to auscultation bilaterally without wheeze, rales, rhonchi  CVS exam: normal rate and regular rhythm.  Abdomen: Soft; nontender; nondistended; normoactive bowel sounds; no masses or hepatosplenomegaly  Musculoskeletal: No deformities; no active joint inflammation  Extremities: No edema, cyanosis, clubbing  Vessels: Symmetric bilaterally  Neurologic: Alert and oriented; speech intact; face symmetrical; moves all extremities well; CNII-XII intact without focal deficit   Assessment:  1. PE (physical exam), annual   2. Needs flu shot   3. Class 1 obesity with body mass index (BMI) of 32.0 to 32.9 in adult, unspecified obesity  type, unspecified whether serious comorbidity present   4. Screening mammogram, encounter for   5. Lipid screening     Plan:  Age appropriate preventive healthcare needs addressed- needs to see her GYN; encouraged regular eye doctor and dental exams; encouraged regular exercise and weight loss; will update labs and refills as needed today; follow-up to be determined;  Not fasting today- she will get labs at later date; refer for weight loss program; re-start Amlodipine 5 mg daily; follow-up in 4 weeks, sooner prn.  Flu shot given today;    This visit occurred during the SARS-CoV-2 public health emergency.  Safety protocols were in place, including screening questions prior to the visit, additional usage of staff PPE, and extensive cleaning of exam room while observing appropriate contact time as indicated for disinfecting solutions.     No follow-ups on file.  Orders Placed This Encounter  Procedures  . MM Digital Screening    Standing Status:   Future    Standing Expiration Date:   05/04/2021    Order Specific Question:   Reason for Exam (SYMPTOM  OR DIAGNOSIS REQUIRED)    Answer:   screening mammogram    Order Specific Question:   Is the patient pregnant?    Answer:   No    Order Specific Question:   Preferred imaging location?    Answer:   [facility]  . Flu Vaccine QUAD 36+ mos IM  . CBC w/Diff    Standing Status:   Future    Standing Expiration Date:   05/04/2021  . Comp Met (CMET)  . Lipid panel    Standing Status:   Future    Standing Expiration Date:   05/04/2021  . TSH    Standing Status:   Future    Standing Expiration Date:   05/04/2021  . Amb Ref to Medical Weight Management    Referral Priority:   Routine    Referral Type:   Consultation    Number of Visits Requested:   1    Requested Prescriptions   Signed Prescriptions Disp Refills  . amLODipine (NORVASC) 5 MG tablet 30 tablet 0    Sig: Take 1 tablet (5 mg total) by mouth daily.

## 2020-05-12 ENCOUNTER — Other Ambulatory Visit: Payer: Self-pay

## 2020-05-12 ENCOUNTER — Ambulatory Visit (INDEPENDENT_AMBULATORY_CARE_PROVIDER_SITE_OTHER): Payer: No Typology Code available for payment source | Admitting: Family Medicine

## 2020-05-12 ENCOUNTER — Encounter (INDEPENDENT_AMBULATORY_CARE_PROVIDER_SITE_OTHER): Payer: Self-pay | Admitting: Family Medicine

## 2020-05-12 VITALS — BP 150/86 | HR 67 | Temp 98.2°F | Ht 62.0 in | Wt 180.0 lb

## 2020-05-12 DIAGNOSIS — E669 Obesity, unspecified: Secondary | ICD-10-CM

## 2020-05-12 DIAGNOSIS — R739 Hyperglycemia, unspecified: Secondary | ICD-10-CM

## 2020-05-12 DIAGNOSIS — E785 Hyperlipidemia, unspecified: Secondary | ICD-10-CM

## 2020-05-12 DIAGNOSIS — E559 Vitamin D deficiency, unspecified: Secondary | ICD-10-CM

## 2020-05-12 DIAGNOSIS — R5383 Other fatigue: Secondary | ICD-10-CM | POA: Diagnosis not present

## 2020-05-12 DIAGNOSIS — Z1331 Encounter for screening for depression: Secondary | ICD-10-CM | POA: Diagnosis not present

## 2020-05-12 DIAGNOSIS — R0602 Shortness of breath: Secondary | ICD-10-CM

## 2020-05-12 DIAGNOSIS — I10 Essential (primary) hypertension: Secondary | ICD-10-CM

## 2020-05-12 DIAGNOSIS — Z9189 Other specified personal risk factors, not elsewhere classified: Secondary | ICD-10-CM | POA: Diagnosis not present

## 2020-05-12 DIAGNOSIS — Z6832 Body mass index (BMI) 32.0-32.9, adult: Secondary | ICD-10-CM

## 2020-05-12 DIAGNOSIS — Z0289 Encounter for other administrative examinations: Secondary | ICD-10-CM

## 2020-05-13 LAB — FOLATE: Folate: 15.2 ng/mL (ref >3.0)

## 2020-05-13 LAB — COMPREHENSIVE METABOLIC PANEL
ALT: 27 IU/L (ref 0–32)
AST: 17 IU/L (ref 0–40)
Albumin/Globulin Ratio: 1.5 (ref 1.2–2.2)
Albumin: 4.8 g/dL (ref 3.8–4.9)
Alkaline Phosphatase: 60 IU/L (ref 44–121)
BUN/Creatinine Ratio: 13 (ref 9–23)
BUN: 12 mg/dL (ref 6–24)
Bilirubin Total: 0.3 mg/dL (ref 0.0–1.2)
CO2: 22 mmol/L (ref 20–29)
Calcium: 9.8 mg/dL (ref 8.7–10.2)
Chloride: 99 mmol/L (ref 96–106)
Creatinine, Ser: 0.92 mg/dL (ref 0.57–1.00)
GFR calc Af Amer: 82 mL/min/{1.73_m2} (ref >59)
GFR calc non Af Amer: 71 mL/min/{1.73_m2} (ref >59)
Globulin, Total: 3.1 g/dL (ref 1.5–4.5)
Glucose: 84 mg/dL (ref 65–99)
Potassium: 4.6 mmol/L (ref 3.5–5.2)
Sodium: 136 mmol/L (ref 134–144)
Total Protein: 7.9 g/dL (ref 6.0–8.5)

## 2020-05-13 LAB — CBC WITH DIFFERENTIAL/PLATELET
Basophils Absolute: 0 10*3/uL (ref 0.0–0.2)
Basos: 1 %
EOS (ABSOLUTE): 0.2 10*3/uL (ref 0.0–0.4)
Eos: 4 %
Hematocrit: 45.5 % (ref 34.0–46.6)
Hemoglobin: 15.2 g/dL (ref 11.1–15.9)
Immature Grans (Abs): 0 10*3/uL (ref 0.0–0.1)
Immature Granulocytes: 0 %
Lymphocytes Absolute: 2.4 10*3/uL (ref 0.7–3.1)
Lymphs: 49 %
MCH: 27.2 pg (ref 26.6–33.0)
MCHC: 33.4 g/dL (ref 31.5–35.7)
MCV: 82 fL (ref 79–97)
Monocytes Absolute: 0.4 10*3/uL (ref 0.1–0.9)
Monocytes: 7 %
Neutrophils Absolute: 2 10*3/uL (ref 1.4–7.0)
Neutrophils: 39 %
Platelets: 294 10*3/uL (ref 150–450)
RBC: 5.58 x10E6/uL — ABNORMAL HIGH (ref 3.77–5.28)
RDW: 14.7 % (ref 11.7–15.4)
WBC: 5 10*3/uL (ref 3.4–10.8)

## 2020-05-13 LAB — LIPID PANEL
Chol/HDL Ratio: 4.5 ratio — ABNORMAL HIGH (ref 0.0–4.4)
Cholesterol, Total: 247 mg/dL — ABNORMAL HIGH (ref 100–199)
HDL: 55 mg/dL (ref >39)
LDL Chol Calc (NIH): 172 mg/dL — ABNORMAL HIGH (ref 0–99)
Triglycerides: 110 mg/dL (ref 0–149)
VLDL Cholesterol Cal: 20 mg/dL (ref 5–40)

## 2020-05-13 LAB — VITAMIN D 25 HYDROXY (VIT D DEFICIENCY, FRACTURES): Vit D, 25-Hydroxy: 13.3 ng/mL — ABNORMAL LOW (ref 30.0–100.0)

## 2020-05-13 LAB — INSULIN, RANDOM: INSULIN: 21.2 u[IU]/mL (ref 2.6–24.9)

## 2020-05-13 LAB — T3: T3, Total: 90 ng/dL (ref 71–180)

## 2020-05-13 LAB — HEMOGLOBIN A1C
Est. average glucose Bld gHb Est-mCnc: 117 mg/dL
Hgb A1c MFr Bld: 5.7 % — ABNORMAL HIGH (ref 4.8–5.6)

## 2020-05-13 LAB — TSH: TSH: 0.957 u[IU]/mL (ref 0.450–4.500)

## 2020-05-13 LAB — VITAMIN B12: Vitamin B-12: 504 pg/mL (ref 232–1245)

## 2020-05-13 LAB — T4: T4, Total: 5.7 ug/dL (ref 4.5–12.0)

## 2020-05-16 NOTE — Progress Notes (Signed)
Chief Complaint:   OBESITY Haley Moreno (MR# [id number]) is a 54 y.o. female who presents for evaluation and treatment of obesity and related comorbidities. Current BMI is Body mass index is 32.92 kg/m. Eesha has been struggling with her weight for many years and has been unsuccessful in either losing weight, maintaining weight loss, or reaching her healthy weight goal.  Zandria is currently in the action stage of change and ready to dedicate time achieving and maintaining a healthier weight. Javayah is interested in becoming our patient and working on intensive lifestyle modifications including (but not limited to) diet and exercise for weight loss.  Katlynne's habits were reviewed today and are as follows: Her family eats meals together, she thinks her family will eat healthier with her, her desired weight loss is 35 lbs, she started gaining weight after menopause, her heaviest weight ever was 181 pounds, she has significant food cravings issues, she snacks frequently in the evenings, she skips meals frequently, she is frequently drinking liquids with calories, she frequently makes poor food choices and she struggles with emotional eating.  Depression Screen Ralph's Food and Mood (modified PHQ-9) score was 14.  Depression screen PHQ 2/9 05/12/2020  Decreased Interest 2  Down, Depressed, Hopeless 1  PHQ - 2 Score 3  Altered sleeping 2  Tired, decreased energy 3  Change in appetite 3  Feeling bad or failure about yourself  1  Trouble concentrating 1  Moving slowly or fidgety/restless 1  Suicidal thoughts 0  PHQ-9 Score 14  Difficult doing work/chores Somewhat difficult   Subjective:   1. Other fatigue Jilene admits to daytime somnolence and denies waking up still tired. Patent has a history of symptoms of daytime fatigue. Shena generally gets 8 hours of sleep per night, and states that she has generally restful sleep. Snoring is present. Apneic episodes are  not present. Epworth Sleepiness Score is 4.  2. SOB (shortness of breath) on exertion Anderson Malta notes increasing shortness of breath with exercising and seems to be worsening over time with weight gain. She notes getting out of breath sooner with activity than she used to. This has not gotten worse recently. Bryelle denies shortness of breath at rest or orthopnea.  3. Essential hypertension Bennette's blood pressure is elevated today. She has been out of amlodipine for a while but her blood pressure is elevated today. She plans on restarting her medication.  4. Hyperglycemia Makynli has a history of elevated glucose readings. She has no recent labs in [place].  5. Hyperlipidemia, unspecified hyperlipidemia type Lillieanna has a history of elevated cholesterol. She is not on statin, and she is attempting to improve with diet.  6. Vitamin D deficiency Keyunna has a history of Vit D deficiency. She is not on Vit D and she notes fatigue.  7. At risk for diabetes mellitus Eveleigh is at higher than average risk for developing diabetes due to her obesity.   Assessment/Plan:   1. Other fatigue Staphany does feel that her weight is causing her energy to be lower than it should be. Fatigue may be related to obesity, depression or many other causes. Labs will be ordered, and in the meanwhile, Paisley will focus on self care including making healthy food choices, increasing physical activity and focusing on stress reduction.  - EKG 12-Lead - Vitamin B12 - CBC with Differential/Platelet - Folate - T3 - T4 - TSH  2. SOB (shortness of breath) on exertion Emalee does feel that she gets out  of breath more easily that she used to when she exercises. Alawna's shortness of breath appears to be obesity related and exercise induced. She has agreed to work on weight loss and gradually increase exercise to treat her exercise induced shortness of breath. Will continue to monitor closely.  3. Essential  hypertension Tekeya will restart her medications, and will start her Category 2 plan. She will continue working on healthy weight loss and exercise to improve blood pressure control. We will watch for signs of hypotension as she continues her lifestyle modifications. We will check labs today.  4. Hyperglycemia Fasting labs will be obtained today, and results with be discussed with Anderson Malta in 2 weeks at her follow up visit. In the meanwhile Alynna will start her Category 2 plan and will work on weight loss efforts.  - Comprehensive metabolic panel - Hemoglobin A1c - Insulin, random  5. Hyperlipidemia, unspecified hyperlipidemia type Cardiovascular risk and specific lipid/LDL goals reviewed. We discussed several lifestyle modifications today. We will check labs today. Flossie will start her Category 2 plan, and will continue to work on exercise and weight loss efforts. Orders and follow up as documented in patient record.   - Lipid panel  6. Vitamin D deficiency Low Vitamin D level contributes to fatigue and are associated with obesity, breast, and colon cancer. We will check labs today. Meagen will follow-up for routine testing of Vitamin D, at least 2-3 times per year to avoid over-replacement.  - VITAMIN D 25 Hydroxy (Vit-D Deficiency, Fractures)  7. Depression screening Matina had a positive depression screening. Depression is commonly associated with obesity and often results in emotional eating behaviors. We will monitor this closely and work on CBT to help improve the non-hunger eating patterns. Referral to Psychology may be required if no improvement is seen as she continues in our clinic.  8. At risk for diabetes mellitus Lisett was given approximately 30 minutes of diabetes education and counseling today. We discussed intensive lifestyle modifications today with an emphasis on weight loss as well as increasing exercise and decreasing simple carbohydrates in her diet. We  also reviewed medication options with an emphasis on risk versus benefit of those discussed.   Repetitive spaced learning was employed today to elicit superior memory formation and behavioral change.  9. Class 1 obesity with serious comorbidity and body mass index (BMI) of 32.0 to 32.9 in adult, unspecified obesity type Loistine is currently in the action stage of change and her goal is to continue with weight loss efforts. I recommend Lexys begin the structured treatment plan as follows:  She has agreed to the Category 2 Plan.  Exercise goals: No exercise has been prescribed for now, while we concentrate on nutritional changes.  Behavioral modification strategies: increasing lean protein intake and no skipping meals.  She was informed of the importance of frequent follow-up visits to maximize her success with intensive lifestyle modifications for her multiple health conditions. She was informed we would discuss her lab results at her next visit unless there is a critical issue that needs to be addressed sooner. Inara agreed to keep her next visit at the agreed upon time to discuss these results.  Objective:   Blood pressure (!) 150/86, pulse 67, temperature 98.2 F (36.8 C), height 5\' 2"  (1.575 m), weight 180 lb (81.6 kg), SpO2 99 %. Body mass index is 32.92 kg/m.  EKG: Normal sinus rhythm, rate 68 BPM.  Indirect Calorimeter completed today shows a VO2 of 235 and a REE of 1635.  Her calculated basal metabolic rate is 5188 thus her basal metabolic rate is better than expected.  General: Cooperative, alert, well developed, in no acute distress. HEENT: Conjunctivae and lids unremarkable. Cardiovascular: Regular rhythm.  Lungs: Normal work of breathing. Neurologic: No focal deficits.   Lab Results  Component Value Date   CREATININE 0.92 05/12/2020   BUN 12 05/12/2020   NA 136 05/12/2020   K 4.6 05/12/2020   CL 99 05/12/2020   CO2 22 05/12/2020   Lab Results  Component  Value Date   ALT 27 05/12/2020   AST 17 05/12/2020   ALKPHOS 60 05/12/2020   BILITOT 0.3 05/12/2020   Lab Results  Component Value Date   HGBA1C 5.7 (H) 05/12/2020   Lab Results  Component Value Date   INSULIN 21.2 05/12/2020   Lab Results  Component Value Date   TSH 0.957 05/12/2020   Lab Results  Component Value Date   CHOL 247 (H) 05/12/2020   HDL 55 05/12/2020   LDLCALC 172 (H) 05/12/2020   TRIG 110 05/12/2020   CHOLHDL 4.5 (H) 05/12/2020   Lab Results  Component Value Date   WBC 5.0 05/12/2020   HGB 15.2 05/12/2020   HCT 45.5 05/12/2020   MCV 82 05/12/2020   PLT 294 05/12/2020   No results found for: IRON, TIBC, FERRITIN  Attestation Statements:   Reviewed by clinician on day of visit: allergies, medications, problem list, medical history, surgical history, family history, social history, and previous encounter notes.   I, Trixie Dredge, am acting as transcriptionist for Dennard Nip, MD.  I have reviewed the above documentation for accuracy and completeness, and I agree with the above. - Dennard Nip, MD

## 2020-05-25 ENCOUNTER — Other Ambulatory Visit: Payer: Self-pay

## 2020-05-25 ENCOUNTER — Ambulatory Visit: Payer: No Typology Code available for payment source | Admitting: Family

## 2020-05-25 ENCOUNTER — Ambulatory Visit
Admission: RE | Admit: 2020-05-25 | Discharge: 2020-05-25 | Disposition: A | Discharge status: Home / Self Care | Payer: No Typology Code available for payment source | Source: Ambulatory Visit | Attending: Family | Admitting: Family

## 2020-05-25 DIAGNOSIS — Z1231 Encounter for screening mammogram for malignant neoplasm of breast: Secondary | ICD-10-CM

## 2020-05-26 ENCOUNTER — Ambulatory Visit (INDEPENDENT_AMBULATORY_CARE_PROVIDER_SITE_OTHER): Payer: BLUE CROSS/BLUE SHIELD | Admitting: Family Medicine

## 2020-05-26 ENCOUNTER — Encounter (INDEPENDENT_AMBULATORY_CARE_PROVIDER_SITE_OTHER): Payer: Self-pay | Admitting: Family Medicine

## 2020-05-26 ENCOUNTER — Other Ambulatory Visit: Payer: Self-pay

## 2020-05-26 VITALS — BP 164/79 | HR 75 | Temp 98.3°F | Ht 62.0 in | Wt 176.0 lb

## 2020-05-26 DIAGNOSIS — E785 Hyperlipidemia, unspecified: Secondary | ICD-10-CM

## 2020-05-26 DIAGNOSIS — Z9189 Other specified personal risk factors, not elsewhere classified: Secondary | ICD-10-CM

## 2020-05-26 DIAGNOSIS — E669 Obesity, unspecified: Secondary | ICD-10-CM

## 2020-05-26 DIAGNOSIS — R7303 Prediabetes: Secondary | ICD-10-CM | POA: Diagnosis not present

## 2020-05-26 DIAGNOSIS — E559 Vitamin D deficiency, unspecified: Secondary | ICD-10-CM | POA: Diagnosis not present

## 2020-05-26 DIAGNOSIS — Z6832 Body mass index (BMI) 32.0-32.9, adult: Secondary | ICD-10-CM

## 2020-05-26 DIAGNOSIS — E7849 Other hyperlipidemia: Secondary | ICD-10-CM | POA: Diagnosis not present

## 2020-05-26 MED ORDER — ERGOCALCIFEROL 1.25 MG (50000 UT) PO CAPS: 50000.0000 [IU] | ORAL_CAPSULE | ORAL | 0 refills | Status: DC

## 2020-05-26 MED ORDER — METFORMIN HCL 500 MG PO TABS: 500.0000 mg | ORAL_TABLET | Freq: Every morning | ORAL | 0 refills | Status: DC

## 2020-05-31 ENCOUNTER — Other Ambulatory Visit: Payer: Self-pay | Admitting: Family

## 2020-05-31 DIAGNOSIS — R928 Other abnormal and inconclusive findings on diagnostic imaging of breast: Secondary | ICD-10-CM

## 2020-06-06 NOTE — Progress Notes (Signed)
Chief Complaint:   OBESITY Haley Moreno is here to discuss her progress with her obesity treatment plan along with follow-up of her obesity related diagnoses. Haley Moreno is on the Category 2 Plan and states she is following her eating plan approximately 50% of the time. Haley Moreno states she is doing 0 minutes 0 times per week.  Today's visit was #: 2 Starting weight: 180 lbs Starting date: 05/12/2020 Today's weight: 176 lbs Today's date: 05/26/2020 Total lbs lost to date: 4 Total lbs lost since last in-office visit: 4  Interim History: Haley Moreno has done well with weight loss but struggled to get started. She had some challenges getting started with her plan, but then she was able to get started. She notes increased PM hunger.  Subjective:   1. Other hyperlipidemia Haley Moreno has a new diagnosis of hyperlipidemia. Her LDL is elevated but HDL and triglycerides are within normal limits. She denies chest pain and she is not on statin. I discussed labs with the patient today.  2. Vitamin D deficiency Haley Moreno has a new diagnosis of Vit D deficiency. She is not on Vit D, and she notes fatigue. I discussed labs with the patient today.  3. Pre-diabetes Haley Moreno has a new diagnosis of pre-diabetes. Her A1c and insulin are elevated. She notes polyphagia especially in the PM. She is working on diet and weight loss. I discussed labs with the patient today.  4. At risk for diabetes mellitus Haley Moreno is at higher than average risk for developing diabetes due to her obesity.   Assessment/Plan:   1. Other hyperlipidemia Cardiovascular risk and specific lipid/LDL goals reviewed. We discussed several lifestyle modifications today. Haley Moreno will continue to work on diet, exercise and weight loss efforts. We will recheck labs in 3 months, may need to start statin depending on her results. Orders and follow up as documented in patient record.   2. Vitamin D deficiency Low Vitamin D level contributes to  fatigue and are associated with obesity, breast, and colon cancer. Haley Moreno agreed to start prescription Vitamin D 50,000 IU every week with no refills, and we will recheck labs in 3 months. She will follow-up for routine testing of Vitamin D, at least 2-3 times per year to avoid over-replacement.  - ergocalciferol (VITAMIN D2) 1.25 MG (50000 UT) capsule; Take 1 capsule (50,000 Units total) by mouth once a week.  Dispense: 4 capsule; Refill: 0  3. Pre-diabetes Haley Moreno will continue to work on weight loss, exercise, and decreasing simple carbohydrates to help decrease the risk of diabetes. Haley Moreno agreed to start metformin 500 mg q AM with no refills, and we will recheck labs in 3 months.  - metFORMIN (GLUCOPHAGE) 500 MG tablet; Take 1 tablet (500 mg total) by mouth in the morning.  Dispense: 30 tablet; Refill: 0  4. At risk for diabetes mellitus Haley Moreno was given approximately 30 minutes of diabetes education and counseling today. We discussed intensive lifestyle modifications today with an emphasis on weight loss as well as increasing exercise and decreasing simple carbohydrates in her diet. We also reviewed medication options with an emphasis on risk versus benefit of those discussed.   Repetitive spaced learning was employed today to elicit superior memory formation and behavioral change.  5. Class 1 obesity with serious comorbidity and body mass index (BMI) of 32.0 to 32.9 in adult, unspecified obesity type Haley Moreno is currently in the action stage of change. As such, her goal is to continue with weight loss efforts. She has agreed to the Category  2 Plan. Lean meat equivalents were discussed.  Behavioral modification strategies: increasing lean protein intake and decreasing simple carbohydrates.  Haley Moreno has agreed to follow-up with our clinic in 2 weeks. She was informed of the importance of frequent follow-up visits to maximize her success with intensive lifestyle modifications for her  multiple health conditions.   Objective:   Blood pressure (!) 164/79, pulse 75, temperature 98.3 F (36.8 C), height 5\' 2"  (1.575 m), weight 176 lb (79.8 kg), last menstrual period 10/29/2018, SpO2 97 %. Body mass index is 32.19 kg/m.  General: Cooperative, alert, well developed, in no acute distress. HEENT: Conjunctivae and lids unremarkable. Cardiovascular: Regular rhythm.  Lungs: Normal work of breathing. Neurologic: No focal deficits.   Lab Results  Component Value Date   CREATININE 0.92 05/12/2020   BUN 12 05/12/2020   NA 136 05/12/2020   K 4.6 05/12/2020   CL 99 05/12/2020   CO2 22 05/12/2020   Lab Results  Component Value Date   ALT 27 05/12/2020   AST 17 05/12/2020   ALKPHOS 60 05/12/2020   BILITOT 0.3 05/12/2020   Lab Results  Component Value Date   HGBA1C 5.7 (H) 05/12/2020   Lab Results  Component Value Date   INSULIN 21.2 05/12/2020   Lab Results  Component Value Date   TSH 0.957 05/12/2020   Lab Results  Component Value Date   CHOL 247 (H) 05/12/2020   HDL 55 05/12/2020   LDLCALC 172 (H) 05/12/2020   TRIG 110 05/12/2020   CHOLHDL 4.5 (H) 05/12/2020   Lab Results  Component Value Date   WBC 5.0 05/12/2020   HGB 15.2 05/12/2020   HCT 45.5 05/12/2020   MCV 82 05/12/2020   PLT 294 05/12/2020   No results found for: IRON, TIBC, FERRITIN  Attestation Statements:   Reviewed by clinician on day of visit: allergies, medications, problem list, medical history, surgical history, family history, social history, and previous encounter notes.   I, Trixie Dredge, am acting as transcriptionist for Dennard Nip, MD.  I have reviewed the above documentation for accuracy and completeness, and I agree with the above. -  Dennard Nip, MD

## 2020-06-08 ENCOUNTER — Ambulatory Visit: Payer: No Typology Code available for payment source

## 2020-06-08 ENCOUNTER — Ambulatory Visit
Admission: RE | Admit: 2020-06-08 | Discharge: 2020-06-08 | Disposition: A | Discharge status: Home / Self Care | Payer: No Typology Code available for payment source | Source: Ambulatory Visit | Attending: Family | Admitting: Family

## 2020-06-08 ENCOUNTER — Other Ambulatory Visit: Payer: Self-pay

## 2020-06-08 DIAGNOSIS — R928 Other abnormal and inconclusive findings on diagnostic imaging of breast: Secondary | ICD-10-CM

## 2020-06-09 ENCOUNTER — Encounter (INDEPENDENT_AMBULATORY_CARE_PROVIDER_SITE_OTHER): Payer: Self-pay | Admitting: Adult Health

## 2020-06-09 ENCOUNTER — Ambulatory Visit (INDEPENDENT_AMBULATORY_CARE_PROVIDER_SITE_OTHER): Payer: No Typology Code available for payment source | Admitting: Adult Health

## 2020-06-09 VITALS — BP 163/86 | HR 77 | Temp 98.2°F | Ht 62.0 in | Wt 175.0 lb

## 2020-06-09 DIAGNOSIS — E669 Obesity, unspecified: Secondary | ICD-10-CM

## 2020-06-09 DIAGNOSIS — E559 Vitamin D deficiency, unspecified: Secondary | ICD-10-CM | POA: Diagnosis not present

## 2020-06-09 DIAGNOSIS — R7303 Prediabetes: Secondary | ICD-10-CM | POA: Diagnosis not present

## 2020-06-09 DIAGNOSIS — I10 Essential (primary) hypertension: Secondary | ICD-10-CM | POA: Diagnosis not present

## 2020-06-09 DIAGNOSIS — Z6832 Body mass index (BMI) 32.0-32.9, adult: Secondary | ICD-10-CM

## 2020-06-09 NOTE — Progress Notes (Signed)
Chief Complaint:   OBESITY Haley Moreno is here to discuss her progress with her obesity treatment plan along with follow-up of her obesity related diagnoses. Haley Moreno is on the Category 2 Plan and states she is following her eating plan approximately 98% of the time. Haley Moreno states she is exercising 0 minutes 0 times per week.  Today's visit was #: 3 Starting weight: 180 lbs Starting date: 05/12/2020 Today's weight: 175 lbs Today's date: 06/09/2020 Total lbs lost to date: 5 Total lbs lost since last in-office visit: 1  Interim History: Haley Moreno was recently started on metformin 500 mg QAM and denies GI upset. She has experienced mild headache since starting metformin. She denies polyphagia. Her ultimate goal is to be healthier without a specific weight in mind.  Subjective:   Essential hypertension. Blood pressure is above goal at today's office visit. Haley Moreno has not taken her amlodipine 5 mg daily - last dose was more than 48 hours ago. She denies lower extremity edema. She denies chest pain, dyspnea, or palpitations.  BP Readings from Last 3 Encounters:  06/09/20 (!) 163/86  05/26/20 (!) 164/79  05/12/20 (!) 150/86   Lab Results  Component Value Date   CREATININE 0.92 05/12/2020   Vitamin D deficiency. Vitamin D level on 05/12/2020 was 13.3, well below goal of 50. Haley Moreno is on Ergocalciferol. No nausea, vomiting, or muscle weakness.    Ref. Range 05/12/2020 11:01  Vitamin D, 25-Hydroxy Latest Ref Range: 30.0 - 100.0 ng/mL 13.3 (L)   Prediabetes. Haley Moreno has a diagnosis of prediabetes based on her elevated HgA1c and was informed this puts her at greater risk of developing diabetes. She continues to work on diet and exercise to decrease her risk of diabetes. She denies nausea or hypoglycemia. 05/12/2020 blood glucose 84, A1c 5.7 with an insulin level of 21.2. Haley Moreno is on metformin 500 mg QAM and denies GI upset. She reports mild headache since starting  metformin. She denies polyphagia.  Lab Results  Component Value Date   HGBA1C 5.7 (H) 05/12/2020   Lab Results  Component Value Date   INSULIN 21.2 05/12/2020   Assessment/Plan:   Essential hypertension. Haley Moreno is working on healthy weight loss and exercise to improve blood pressure control. We will watch for signs of hypotension as she continues her lifestyle modifications. She was instructed to take her calcium channel blocker daily as directed.   Vitamin D deficiency. Low Vitamin D level contributes to fatigue and are associated with obesity, breast, and colon cancer. She agrees to continue to take Ergocalciferol as directed (no medication refill today) and will follow-up for routine testing of Vitamin D, at least 2-3 times per year to avoid over-replacement.  Prediabetes. Haley Moreno will continue to work on weight loss, exercise, and decreasing simple carbohydrates to help decrease the risk of diabetes. She will continue her metformin as directed and will increase her water intake.  Class 1 obesity with serious comorbidity and body mass index (BMI) of 32.0 to 32.9 in adult, unspecified obesity type.  Haley Moreno is currently in the action stage of change. As such, her goal is to continue with weight loss efforts. She has agreed to the Category 2 Plan.   Exercise goals: No exercise has been prescribed at this time.  Behavioral modification strategies: increasing lean protein intake, decreasing simple carbohydrates, increasing water intake, no skipping meals, meal planning and cooking strategies and planning for success.  Haley Moreno has agreed to follow-up with our clinic in 3 weeks. She was informed  of the importance of frequent follow-up visits to maximize her success with intensive lifestyle modifications for her multiple health conditions.   Objective:   Blood pressure (!) 163/86, pulse 77, temperature 98.2 F (36.8 C), temperature source Oral, height 5\' 2"  (1.575 m), weight 175 lb  (79.4 kg), last menstrual period 10/29/2018, SpO2 99 %. Body mass index is 32.01 kg/m.  General: Cooperative, alert, well developed, in no acute distress. HEENT: Conjunctivae and lids unremarkable. Cardiovascular: Regular rhythm.  Lungs: Normal work of breathing. Neurologic: No focal deficits.   Lab Results  Component Value Date   CREATININE 0.92 05/12/2020   BUN 12 05/12/2020   NA 136 05/12/2020   K 4.6 05/12/2020   CL 99 05/12/2020   CO2 22 05/12/2020   Lab Results  Component Value Date   ALT 27 05/12/2020   AST 17 05/12/2020   ALKPHOS 60 05/12/2020   BILITOT 0.3 05/12/2020   Lab Results  Component Value Date   HGBA1C 5.7 (H) 05/12/2020   Lab Results  Component Value Date   INSULIN 21.2 05/12/2020   Lab Results  Component Value Date   TSH 0.957 05/12/2020   Lab Results  Component Value Date   CHOL 247 (H) 05/12/2020   HDL 55 05/12/2020   LDLCALC 172 (H) 05/12/2020   TRIG 110 05/12/2020   CHOLHDL 4.5 (H) 05/12/2020   Lab Results  Component Value Date   WBC 5.0 05/12/2020   HGB 15.2 05/12/2020   HCT 45.5 05/12/2020   MCV 82 05/12/2020   PLT 294 05/12/2020   No results found for: IRON, TIBC, FERRITIN  Attestation Statements:   Reviewed by clinician on day of visit: allergies, medications, problem list, medical history, surgical history, family history, social history, and previous encounter notes.  Time spent on visit including pre-visit chart review and post-visit charting and care was 29 minutes.   I, Michaelene Song, am acting as Location manager for [employer], NP-C   I have reviewed the above documentation for accuracy and completeness, and I agree with the above. -  Phyllis Abelson d. Deaysia Grigoryan, NP-C

## 2020-06-10 DIAGNOSIS — Z6832 Body mass index (BMI) 32.0-32.9, adult: Secondary | ICD-10-CM | POA: Insufficient documentation

## 2020-06-10 DIAGNOSIS — E669 Obesity, unspecified: Secondary | ICD-10-CM | POA: Insufficient documentation

## 2020-06-27 ENCOUNTER — Ambulatory Visit (INDEPENDENT_AMBULATORY_CARE_PROVIDER_SITE_OTHER): Payer: No Typology Code available for payment source | Admitting: Family Medicine

## 2020-06-28 ENCOUNTER — Other Ambulatory Visit: Payer: Self-pay

## 2020-06-28 ENCOUNTER — Encounter (INDEPENDENT_AMBULATORY_CARE_PROVIDER_SITE_OTHER): Payer: Self-pay | Admitting: Family Medicine

## 2020-06-28 ENCOUNTER — Ambulatory Visit (INDEPENDENT_AMBULATORY_CARE_PROVIDER_SITE_OTHER): Payer: No Typology Code available for payment source | Admitting: Family Medicine

## 2020-06-28 VITALS — BP 164/78 | HR 67 | Temp 97.9°F | Ht 60.0 in | Wt 170.0 lb

## 2020-06-28 DIAGNOSIS — Z9189 Other specified personal risk factors, not elsewhere classified: Secondary | ICD-10-CM | POA: Diagnosis not present

## 2020-06-28 DIAGNOSIS — E669 Obesity, unspecified: Secondary | ICD-10-CM | POA: Diagnosis not present

## 2020-06-28 DIAGNOSIS — I10 Essential (primary) hypertension: Secondary | ICD-10-CM | POA: Diagnosis not present

## 2020-06-28 DIAGNOSIS — E559 Vitamin D deficiency, unspecified: Secondary | ICD-10-CM

## 2020-06-28 DIAGNOSIS — R7303 Prediabetes: Secondary | ICD-10-CM

## 2020-06-28 DIAGNOSIS — Z6831 Body mass index (BMI) 31.0-31.9, adult: Secondary | ICD-10-CM

## 2020-06-28 MED ORDER — ERGOCALCIFEROL 1.25 MG (50000 UT) PO CAPS: 50000.0000 [IU] | ORAL_CAPSULE | ORAL | 0 refills | Status: DC

## 2020-06-28 MED ORDER — METFORMIN HCL 500 MG PO TABS: 500.0000 mg | ORAL_TABLET | Freq: Two times a day (BID) | ORAL | 0 refills | Status: DC

## 2020-06-29 NOTE — Progress Notes (Signed)
Chief Complaint:   OBESITY Haley Moreno is here to discuss her progress with her obesity treatment plan along with follow-up of her obesity related diagnoses. Haley Moreno is on the Category 2 Plan and states she is following her eating plan approximately 90% of the time. Haley Moreno states she is on the elliptical for 30-60 minutes 5 times per week.  Today's visit was #: 4 Starting weight: 180 lbs Starting date: 05/12/2020 Today's weight: 170 lbs Today's date: 06/28/2020 Total lbs lost to date: 10 Total lbs lost since last in-office visit: 5  Interim History: Haley Moreno continues to do well with weight loss on her plan. She enjoys her extra breakfast options and she is starting to make plans for Thanksgiving.  Subjective:   1. Vitamin D deficiency Haley Moreno is stable on Vit D, and she requests a refill today. Her Vit D level is not yet at goal.  2. Pre-diabetes Haley Moreno is tolerating metformin well but she notes increased PM polyphagia still.  3. Essential hypertension Haley Moreno's blood pressure is elevated today, and it has been elevated despite her medications for over 1 year now. She denies chest pain.  4. At risk for heart disease Haley Moreno is at a higher than average risk for cardiovascular disease due to obesity.   Assessment/Plan:   1. Vitamin D deficiency Low Vitamin D level contributes to fatigue and are associated with obesity, breast, and colon cancer. We will refill prescription Vitamin D for 1 month. [name] will follow-up for routine testing of Vitamin D, at least 2-3 times per year to avoid over-replacement.  - ergocalciferol (VITAMIN D2) 1.25 MG (50000 UT) capsule; Take 1 capsule (50,000 Units total) by mouth once a week.  Dispense: 4 capsule; Refill: 0  2. Pre-diabetes Haley Moreno will continue to work on weight loss, exercise, and decreasing simple carbohydrates to help decrease the risk of diabetes. Haley Moreno agreed to increase metformin to 500 mg BID with no refill, and  will follow closely.  - metFORMIN (GLUCOPHAGE) 500 MG tablet; Take 1 tablet (500 mg total) by mouth in the morning and at bedtime.  Dispense: 60 tablet; Refill: 0  3. Essential hypertension Haley Moreno is to work on taking her medications at a regular time and we will recheck her blood pressure in 2 weeks. If no improvement, then we will need to adjust her medications while she continues to work on diet, exercise, and healthy weight loss to improve blood pressure control. We will watch for signs of hypotension as she continues her lifestyle modifications.  4. At risk for heart disease Haley Moreno was given approximately 15 minutes of coronary artery disease prevention counseling today. She is 54 y.o. female and has risk factors for heart disease including obesity. We discussed intensive lifestyle modifications today with an emphasis on specific weight loss instructions and strategies.   Repetitive spaced learning was employed today to elicit superior memory formation and behavioral change.  5. Class 1 obesity with serious comorbidity and body mass index (BMI) of 31.0 to 31.9 in adult, unspecified obesity type Haley Moreno is currently in the action stage of change. As such, her goal is to continue with weight loss efforts. She has agreed to the Category 2 Plan with breakfast options.   Exercise goals: As is.  Behavioral modification strategies: holiday eating strategies .  Haley Moreno has agreed to follow-up with our clinic in 2 to 3 weeks. She was informed of the importance of frequent follow-up visits to maximize her success with intensive lifestyle modifications for her multiple health conditions.  Objective:   Blood pressure (!) 164/78, pulse 67, temperature 97.9 F (36.6 C), height 5' (1.524 m), weight 170 lb (77.1 kg), last menstrual period 10/29/2018, SpO2 98 %. Body mass index is 33.2 kg/m.  General: Cooperative, alert, well developed, in no acute distress. HEENT: Conjunctivae and lids  unremarkable. Cardiovascular: Regular rhythm.  Lungs: Normal work of breathing. Neurologic: No focal deficits.   Lab Results  Component Value Date   CREATININE 0.92 05/12/2020   BUN 12 05/12/2020   NA 136 05/12/2020   K 4.6 05/12/2020   CL 99 05/12/2020   CO2 22 05/12/2020   Lab Results  Component Value Date   ALT 27 05/12/2020   AST 17 05/12/2020   ALKPHOS 60 05/12/2020   BILITOT 0.3 05/12/2020   Lab Results  Component Value Date   HGBA1C 5.7 (H) 05/12/2020   Lab Results  Component Value Date   INSULIN 21.2 05/12/2020   Lab Results  Component Value Date   TSH 0.957 05/12/2020   Lab Results  Component Value Date   CHOL 247 (H) 05/12/2020   HDL 55 05/12/2020   LDLCALC 172 (H) 05/12/2020   TRIG 110 05/12/2020   CHOLHDL 4.5 (H) 05/12/2020   Lab Results  Component Value Date   WBC 5.0 05/12/2020   HGB 15.2 05/12/2020   HCT 45.5 05/12/2020   MCV 82 05/12/2020   PLT 294 05/12/2020   No results found for: IRON, TIBC, FERRITIN  Attestation Statements:   Reviewed by clinician on day of visit: allergies, medications, problem list, medical history, surgical history, family history, social history, and previous encounter notes.   I, Trixie Dredge, am acting as transcriptionist for Dennard Nip, MD.  I have reviewed the above documentation for accuracy and completeness, and I agree with the above. -  Dennard Nip, MD

## 2020-07-12 ENCOUNTER — Other Ambulatory Visit: Payer: Self-pay

## 2020-07-12 ENCOUNTER — Encounter (INDEPENDENT_AMBULATORY_CARE_PROVIDER_SITE_OTHER): Payer: Self-pay | Admitting: Family Medicine

## 2020-07-12 ENCOUNTER — Ambulatory Visit (INDEPENDENT_AMBULATORY_CARE_PROVIDER_SITE_OTHER): Payer: No Typology Code available for payment source | Admitting: Family Medicine

## 2020-07-12 VITALS — BP 131/80 | HR 86 | Temp 98.7°F | Ht 60.0 in | Wt 172.0 lb

## 2020-07-12 DIAGNOSIS — E669 Obesity, unspecified: Secondary | ICD-10-CM | POA: Diagnosis not present

## 2020-07-12 DIAGNOSIS — Z6833 Body mass index (BMI) 33.0-33.9, adult: Secondary | ICD-10-CM

## 2020-07-12 DIAGNOSIS — Z9189 Other specified personal risk factors, not elsewhere classified: Secondary | ICD-10-CM

## 2020-07-12 DIAGNOSIS — R7303 Prediabetes: Secondary | ICD-10-CM

## 2020-07-12 DIAGNOSIS — E559 Vitamin D deficiency, unspecified: Secondary | ICD-10-CM | POA: Diagnosis not present

## 2020-07-12 MED ORDER — ERGOCALCIFEROL 1.25 MG (50000 UT) PO CAPS: 50000.0000 [IU] | ORAL_CAPSULE | ORAL | 0 refills | Status: DC

## 2020-07-18 NOTE — Progress Notes (Signed)
Chief Complaint:   OBESITY Yina is here to discuss her progress with her obesity treatment plan along with follow-up of her obesity related diagnoses. Jaymes is on the Category 2 Plan and states she is following her eating plan approximately 50% of the time. Dimond states she is on the elliptical for 60 minutes 3 times per week.  Today's visit was #: 5 Starting weight: 180 lbs Starting date: 05/12/2020 Today's weight: 172 lbs Today's date: 07/12/2020 Total lbs lost to date: 8 Total lbs lost since last in-office visit: 0  Interim History: [place] worked on minimizing weight gain over Thanksgiving. She tried to portion control and increase lean protein and is trying to get rid of leftovers. She is ready to get back on track with her meal plan.  Subjective:   1. Vitamin D deficiency Chong is stable on Vit D, and she denies nausea or vomiting. She requests a refill today.  2. Pre-diabetes Miyu is doing well on metformin, and sometimes doesn't take her second dose until later in the PM. She denies nausea or vomiting.  3. At risk for diabetes mellitus Katrinka is at higher than average risk for developing diabetes due to obesity.   Assessment/Plan:   1. Vitamin D deficiency Low Vitamin D level contributes to fatigue and are associated with obesity, breast, and colon cancer. We will refill prescription Vitamin D for 1 month. Saul will follow-up for routine testing of Vitamin D, at least 2-3 times per year to avoid over-replacement.  - ergocalciferol (VITAMIN D2) 1.25 MG (50000 UT) capsule; Take 1 capsule (50,000 Units total) by mouth once a week.  Dispense: 4 capsule; Refill: 0  2. Pre-diabetes Francenia will continues metformin, and will continue to work on weight loss, exercise, and decreasing simple carbohydrates to help decrease the risk of diabetes.   3. At risk for diabetes mellitus Mairi was given approximately 15 minutes of diabetes education and  counseling today. We discussed intensive lifestyle modifications today with an emphasis on weight loss as well as increasing exercise and decreasing simple carbohydrates in her diet. We also reviewed medication options with an emphasis on risk versus benefit of those discussed.   Repetitive spaced learning was employed today to elicit superior memory formation and behavioral change.  4. Class 1 obesity with serious comorbidity and body mass index (BMI) of 33.0 to 33.9 in adult, unspecified obesity type Hue is currently in the action stage of change. As such, her goal is to continue with weight loss efforts. She has agreed to the Category 2 Plan.   Exercise goals: As is.  Behavioral modification strategies: meal planning and cooking strategies.  Fara has agreed to follow-up with our clinic in 2 weeks. She was informed of the importance of frequent follow-up visits to maximize her success with intensive lifestyle modifications for her multiple health conditions.   Objective:   Blood pressure 131/80, pulse 86, temperature 98.7 F (37.1 C), height 5' (1.524 m), weight 172 lb (78 kg), last menstrual period 10/29/2018, SpO2 96 %. Body mass index is 33.59 kg/m.  General: Cooperative, alert, well developed, in no acute distress. HEENT: Conjunctivae and lids unremarkable. Cardiovascular: Regular rhythm.  Lungs: Normal work of breathing. Neurologic: No focal deficits.   Lab Results  Component Value Date   CREATININE 0.92 05/12/2020   BUN 12 05/12/2020   NA 136 05/12/2020   K 4.6 05/12/2020   CL 99 05/12/2020   CO2 22 05/12/2020   Lab Results  Component Value Date  ALT 27 05/12/2020   AST 17 05/12/2020   ALKPHOS 60 05/12/2020   BILITOT 0.3 05/12/2020   Lab Results  Component Value Date   HGBA1C 5.7 (H) 05/12/2020   Lab Results  Component Value Date   INSULIN 21.2 05/12/2020   Lab Results  Component Value Date   TSH 0.957 05/12/2020   Lab Results  Component Value  Date   CHOL 247 (H) 05/12/2020   HDL 55 05/12/2020   LDLCALC 172 (H) 05/12/2020   TRIG 110 05/12/2020   CHOLHDL 4.5 (H) 05/12/2020   Lab Results  Component Value Date   WBC 5.0 05/12/2020   HGB 15.2 05/12/2020   HCT 45.5 05/12/2020   MCV 82 05/12/2020   PLT 294 05/12/2020   No results found for: IRON, TIBC, FERRITIN  Attestation Statements:   Reviewed by clinician on day of visit: allergies, medications, problem list, medical history, surgical history, family history, social history, and previous encounter notes.   I, Trixie Dredge, am acting as transcriptionist for Dennard Nip, MD.  I have reviewed the above documentation for accuracy and completeness, and I agree with the above. -  Dennard Nip, MD

## 2020-08-02 ENCOUNTER — Ambulatory Visit (INDEPENDENT_AMBULATORY_CARE_PROVIDER_SITE_OTHER): Payer: No Typology Code available for payment source | Admitting: Family Medicine

## 2020-08-02 ENCOUNTER — Other Ambulatory Visit: Payer: Self-pay

## 2020-08-02 ENCOUNTER — Encounter (INDEPENDENT_AMBULATORY_CARE_PROVIDER_SITE_OTHER): Payer: Self-pay | Admitting: Family Medicine

## 2020-08-02 VITALS — BP 112/75 | HR 98 | Temp 98.5°F | Ht 62.0 in | Wt 169.0 lb

## 2020-08-02 DIAGNOSIS — E669 Obesity, unspecified: Secondary | ICD-10-CM

## 2020-08-02 DIAGNOSIS — I10 Essential (primary) hypertension: Secondary | ICD-10-CM

## 2020-08-02 DIAGNOSIS — R7303 Prediabetes: Secondary | ICD-10-CM

## 2020-08-02 DIAGNOSIS — E559 Vitamin D deficiency, unspecified: Secondary | ICD-10-CM | POA: Diagnosis not present

## 2020-08-02 DIAGNOSIS — Z6831 Body mass index (BMI) 31.0-31.9, adult: Secondary | ICD-10-CM

## 2020-08-02 MED ORDER — ERGOCALCIFEROL 1.25 MG (50000 UT) PO CAPS: 50000.0000 [IU] | ORAL_CAPSULE | ORAL | 0 refills | Status: DC

## 2020-08-02 MED ORDER — METFORMIN HCL 500 MG PO TABS: 500.0000 mg | ORAL_TABLET | Freq: Two times a day (BID) | ORAL | 0 refills | Status: DC

## 2020-08-02 MED ORDER — AMLODIPINE BESYLATE 5 MG PO TABS: 5.0000 mg | ORAL_TABLET | Freq: Every day | ORAL | 0 refills | Status: DC

## 2020-08-03 NOTE — Progress Notes (Signed)
Chief Complaint:   OBESITY Haley Moreno is here to discuss her progress with her obesity treatment plan along with follow-up of her obesity related diagnoses. [name] is on the Category 2 Plan and states she is following her eating plan approximately 50% of the time. [name] states she is on the elliptical for 60 minutes 3 times per week.  Today's visit was #: 6 Starting weight: 180 lbs Starting date: 05/12/2020 Today's weight: 169 lbs Today's date: 08/02/2020 Total lbs lost to date: 11 Total lbs lost since last in-office visit: 3  Interim History: [name] continues to do well with weight loss over the holidays. She is working on increasing her protein and she has good eating strategies over Christmas. She is getting good support from her family.  Subjective:   1. Pre-diabetes Haley Moreno is working on diet and weight loss, and she needs a refill today.  2. Vitamin D deficiency Haley Moreno is stable on Vit D, and she denies nausea, vomiting, or muscle weakness.  3. Essential hypertension [name]'s blood pressure is well controlled, and she denies signs of lightheadedness or dizziness. She requests a refill today.  Assessment/Plan:   1. Pre-diabetes [name] will continue to work on weight loss, exercise, and decreasing simple carbohydrates to help decrease the risk of diabetes. We will refill metformin for 1 month.  - metFORMIN (GLUCOPHAGE) 500 MG tablet; Take 1 tablet (500 mg total) by mouth in the morning and at bedtime.  Dispense: 60 tablet; Refill: 0  2. Vitamin D deficiency Low Vitamin D level contributes to fatigue and are associated with obesity, breast, and colon cancer. We will refill prescription Vitamin D for 1 month. Haley Moreno will follow-up for routine testing of Vitamin D, at least 2-3 times per year to avoid over-replacement.  - ergocalciferol (VITAMIN D2) 1.25 MG (50000 UT) capsule; Take 1 capsule (50,000 Units total) by mouth once a week.  Dispense: 4 capsule;  Refill: 0  3. Essential hypertension Haley Moreno is working on healthy weight loss and exercise to improve blood pressure control. We will watch for signs of hypotension as she continues her lifestyle modifications. We will refill amlodipine for 1 month, and will continue to follow up with her primary care provider.  - amLODipine (NORVASC) 5 MG tablet; Take 1 tablet (5 mg total) by mouth daily.  Dispense: 30 tablet; Refill: 0  4. Class 1 obesity with serious comorbidity and body mass index (BMI) of 31.0 to 31.9 in adult, unspecified obesity type [name] is currently in the action stage of change. As such, her goal is to continue with weight loss efforts. She has agreed to the Category 2 Plan.   Smart fruit options were given today.  Exercise goals: As is.  Behavioral modification strategies: increasing lean protein intake and meal planning and cooking strategies.  Haley Moreno has agreed to follow-up with our clinic in 3 weeks. She was informed of the importance of frequent follow-up visits to maximize her success with intensive lifestyle modifications for her multiple health conditions.   Objective:   Blood pressure 112/75, pulse 98, temperature 98.5 F (36.9 C), height 5\' 2"  (1.575 m), weight 169 lb (76.7 kg), last menstrual period 10/29/2018, SpO2 95 %. Body mass index is 30.91 kg/m.  General: Cooperative, alert, well developed, in no acute distress. HEENT: Conjunctivae and lids unremarkable. Cardiovascular: Regular rhythm.  Lungs: Normal work of breathing. Neurologic: No focal deficits.   Lab Results  Component Value Date   CREATININE 0.92 05/12/2020   BUN 12 05/12/2020  NA 136 05/12/2020   K 4.6 05/12/2020   CL 99 05/12/2020   CO2 22 05/12/2020   Lab Results  Component Value Date   ALT 27 05/12/2020   AST 17 05/12/2020   ALKPHOS 60 05/12/2020   BILITOT 0.3 05/12/2020   Lab Results  Component Value Date   HGBA1C 5.7 (H) 05/12/2020   Lab Results  Component Value Date    INSULIN 21.2 05/12/2020   Lab Results  Component Value Date   TSH 0.957 05/12/2020   Lab Results  Component Value Date   CHOL 247 (H) 05/12/2020   HDL 55 05/12/2020   LDLCALC 172 (H) 05/12/2020   TRIG 110 05/12/2020   CHOLHDL 4.5 (H) 05/12/2020   Lab Results  Component Value Date   WBC 5.0 05/12/2020   HGB 15.2 05/12/2020   HCT 45.5 05/12/2020   MCV 82 05/12/2020   PLT 294 05/12/2020   No results found for: IRON, TIBC, FERRITIN  Attestation Statements:   Reviewed by clinician on day of visit: allergies, medications, problem list, medical history, surgical history, family history, social history, and previous encounter notes.   I, Trixie Dredge, am acting as transcriptionist for Dennard Nip, MD.  I have reviewed the above documentation for accuracy and completeness, and I agree with the above. -  Dennard Nip, MD

## 2020-08-24 ENCOUNTER — Ambulatory Visit (INDEPENDENT_AMBULATORY_CARE_PROVIDER_SITE_OTHER): Payer: No Typology Code available for payment source | Admitting: Family Medicine

## 2020-09-07 ENCOUNTER — Ambulatory Visit (INDEPENDENT_AMBULATORY_CARE_PROVIDER_SITE_OTHER): Payer: No Typology Code available for payment source | Admitting: Family Medicine

## 2020-09-28 ENCOUNTER — Telehealth (INDEPENDENT_AMBULATORY_CARE_PROVIDER_SITE_OTHER): Payer: No Typology Code available for payment source | Admitting: Family Medicine

## 2020-09-28 ENCOUNTER — Other Ambulatory Visit: Payer: Self-pay

## 2020-09-28 ENCOUNTER — Encounter (INDEPENDENT_AMBULATORY_CARE_PROVIDER_SITE_OTHER): Payer: Self-pay | Admitting: Family Medicine

## 2020-09-28 DIAGNOSIS — E669 Obesity, unspecified: Secondary | ICD-10-CM

## 2020-09-28 DIAGNOSIS — R7303 Prediabetes: Secondary | ICD-10-CM | POA: Diagnosis not present

## 2020-09-28 DIAGNOSIS — Z9189 Other specified personal risk factors, not elsewhere classified: Secondary | ICD-10-CM | POA: Diagnosis not present

## 2020-09-28 DIAGNOSIS — I10 Essential (primary) hypertension: Secondary | ICD-10-CM | POA: Diagnosis not present

## 2020-09-28 DIAGNOSIS — E559 Vitamin D deficiency, unspecified: Secondary | ICD-10-CM

## 2020-09-28 DIAGNOSIS — Z6831 Body mass index (BMI) 31.0-31.9, adult: Secondary | ICD-10-CM

## 2020-09-28 MED ORDER — METFORMIN HCL 500 MG PO TABS: 500.0000 mg | ORAL_TABLET | Freq: Two times a day (BID) | ORAL | 0 refills | Status: DC

## 2020-09-28 MED ORDER — ERGOCALCIFEROL 1.25 MG (50000 UT) PO CAPS: 50000.0000 [IU] | ORAL_CAPSULE | ORAL | 0 refills | Status: DC

## 2020-09-28 MED ORDER — AMLODIPINE BESYLATE 5 MG PO TABS: 5.0000 mg | ORAL_TABLET | Freq: Every day | ORAL | 0 refills | Status: DC

## 2020-09-29 NOTE — Progress Notes (Signed)
TeleHealth Visit:  Due to the COVID-19 pandemic, this visit was completed with telemedicine (audio/video) technology to reduce patient and provider exposure as well as to preserve personal protective equipment.   Haley Moreno has verbally consented to this TeleHealth visit. The patient is located at home, the provider is located at the [facility] office. The participants in this visit include the listed provider and patient. The visit was conducted today via MyChart video.   Chief Complaint: OBESITY Haley Moreno is here to discuss her progress with her obesity treatment plan along with follow-up of her obesity related diagnoses. Haley Moreno is on the Category 2 Plan and states she is following her eating plan approximately 40% of the time. Haley Moreno states she is on the elliptical for 60 minutes 3 times per week.  Today's visit was #: 6 Starting weight: 180 lbs Starting date: 05/12/2020  Interim History: Haley Moreno's last visit was approximately 2 months ago, right before the holidays. She has been struggling with staying on the track with her eating plan, and she is open to looking at other options.  Subjective:   1. Essential hypertension Haley Moreno is stable on her medications, no blood pressure log today. She has struggled with weight loss recently, but she is ready to get back on track.  2. Vitamin D deficiency Haley Moreno is stable on Vit D, and she has been outside more recently.   3. Pre-diabetes Haley Moreno is tolerating metformin well and she is struggling with weight loss, but she is ready to get back on track. She is working on decreasing simple carbohydrates in her diet and increasing activity.  4. At risk for diabetes mellitus Haley Moreno is at higher than average risk for developing diabetes due to obesity.   Assessment/Plan:   1. Essential hypertension Haley Moreno is working on healthy weight loss and exercise to improve blood pressure control. We will watch for signs of  hypotension as she continues her lifestyle modifications. We will refill amlodipine for 1 month, and we will recheck her blood pressure in 3-4 weeks.  - amLODipine (NORVASC) 5 MG tablet; Take 1 tablet (5 mg total) by mouth daily.  Dispense: 30 tablet; Refill: 0  2. Vitamin D deficiency Low Vitamin D level contributes to fatigue and are associated with obesity, breast, and colon cancer. We will refill prescription Vitamin D for 1 month. Haley Moreno will follow-up for routine testing of Vitamin D, at least 2-3 times per year to avoid over-replacement.  - ergocalciferol (VITAMIN D2) 1.25 MG (50000 UT) capsule; Take 1 capsule (50,000 Units total) by mouth once a week.  Dispense: 4 capsule; Refill: 0  3. Pre-diabetes Haley Moreno will continue diet, weight loss efforts, and decreasing simple carbohydrates to help decrease the risk of diabetes. We will refill metformin for 1 month.  - metFORMIN (GLUCOPHAGE) 500 MG tablet; Take 1 tablet (500 mg total) by mouth in the morning and at bedtime.  Dispense: 60 tablet; Refill: 0  4. At risk for diabetes mellitus Haley Moreno was given approximately 15 minutes of diabetes education and counseling today. We discussed intensive lifestyle modifications today with an emphasis on weight loss as well as increasing exercise and decreasing simple carbohydrates in her diet. We also reviewed medication options with an emphasis on risk versus benefit of those discussed.   Repetitive spaced learning was employed today to elicit superior memory formation and behavioral change.  5. Class 1 obesity with serious comorbidity and body mass index (BMI) of 31.0 to 31.9 in adult, unspecified obesity type Haley Moreno is  currently in the action stage of change. As such, her goal is to continue with weight loss efforts. She has agreed to keeping a food journal and adhering to recommended goals of 1200-1400 calories and 80+ grams of protein daily.   Exercise goals: As is.  Behavioral  modification strategies: meal planning and cooking strategies, planning for success and keeping a strict food journal.  Haley Moreno has agreed to follow-up with our clinic in 3 to 4 weeks. She was informed of the importance of frequent follow-up visits to maximize her success with intensive lifestyle modifications for her multiple health conditions.  Objective:   VITALS: Per patient if applicable, see vitals. GENERAL: Alert and in no acute distress. CARDIOPULMONARY: No increased WOB. Speaking in clear sentences.  PSYCH: Pleasant and cooperative. Speech normal rate and rhythm. Affect is appropriate. Insight and judgement are appropriate. Attention is focused, linear, and appropriate.  NEURO: Oriented as arrived to appointment on time with no prompting.   Lab Results  Component Value Date   CREATININE 0.92 05/12/2020   BUN 12 05/12/2020   NA 136 05/12/2020   K 4.6 05/12/2020   CL 99 05/12/2020   CO2 22 05/12/2020   Lab Results  Component Value Date   ALT 27 05/12/2020   AST 17 05/12/2020   ALKPHOS 60 05/12/2020   BILITOT 0.3 05/12/2020   Lab Results  Component Value Date   HGBA1C 5.7 (H) 05/12/2020   Lab Results  Component Value Date   INSULIN 21.2 05/12/2020   Lab Results  Component Value Date   TSH 0.957 05/12/2020   Lab Results  Component Value Date   CHOL 247 (H) 05/12/2020   HDL 55 05/12/2020   LDLCALC 172 (H) 05/12/2020   TRIG 110 05/12/2020   CHOLHDL 4.5 (H) 05/12/2020   Lab Results  Component Value Date   WBC 5.0 05/12/2020   HGB 15.2 05/12/2020   HCT 45.5 05/12/2020   MCV 82 05/12/2020   PLT 294 05/12/2020   No results found for: IRON, TIBC, FERRITIN  Attestation Statements:   Reviewed by clinician on day of visit: allergies, medications, problem list, medical history, surgical history, family history, social history, and previous encounter notes.   I, Trixie Dredge, am acting as transcriptionist for Dennard Nip, MD.  I have reviewed the above  documentation for accuracy and completeness, and I agree with the above. - Dennard Nip, MD

## 2020-12-09 ENCOUNTER — Ambulatory Visit: Payer: No Typology Code available for payment source | Attending: Internal Medicine

## 2020-12-09 DIAGNOSIS — Z20822 Contact with and (suspected) exposure to covid-19: Secondary | ICD-10-CM

## 2020-12-10 ENCOUNTER — Telehealth: Payer: Self-pay

## 2020-12-10 LAB — SARS-COV-2, NAA 2 DAY TAT

## 2020-12-10 LAB — NOVEL CORONAVIRUS, NAA: SARS-CoV-2, NAA: DETECTED — AB

## 2020-12-10 NOTE — Telephone Encounter (Signed)
Patient notified of positive COVID-19 test results. Pt verbalized understanding. Pt reports symptoms no sx. Criteria for self-isolation if you test positive for COVID-19, regardless of vaccination status:  -If you have mild symptoms that are resolving or have resolved, isolate at home for 5 days since symptoms started AND continue to wear a well-fitted mask when around others in the home and in public for 5 additional days after isolation is completed -If you have a fever and/or moderate to severe symptoms, isolate for at least 10 days since the symptoms started AND until you are fever free for at least 24 hours without the use of fever-reducing medications -If you tested positive and did not have symptoms, isolate for at least 5 days after your positive test  Use over-the-counter medications for symptoms.If you develop respiratory issues/distress, seek medical care in the Emergency Department.  If you must leave home or if you have to be around others please wear a mask. Please limit contact with immediate family members in the home, practice social distancing, frequent handwashing and clean hard surfaces touched frequently with household cleaning products. Members of your household will also need to quarantine and test.You may also be contacted by the health department for follow up. [facility] Department notified.

## 2020-12-11 ENCOUNTER — Telehealth: Payer: Self-pay | Admitting: Family

## 2020-12-11 MED ORDER — NIRMATRELVIR/RITONAVIR (PAXLOVID)TABLET: 3.0000 | ORAL_TABLET | Freq: Two times a day (BID) | ORAL | 0 refills | Status: AC

## 2020-12-11 NOTE — Telephone Encounter (Signed)
Called to discuss with patient about COVID-19 symptoms and the use of one of the available treatments for those with mild to moderate Covid symptoms and at a high risk of hospitalization.  Pt appears to qualify for outpatient treatment due to co-morbid conditions and/or a member of an at-risk group in accordance with the FDA Emergency Use Authorization.    Symptom onset: 4/30 Vaccinated: Vaccinated Booster? Yes Immunocompromised? No Qualifiers: Hypertension, hyperlipidemia   Haley Moreno tested positive for Covid on 4/29 with symptoms onset of 4/30. She is no having diarrhea and body aches. We discussed the risks, benefits, and treatment options and she has agreed to proceed with Paxlovid. GFR was 82.   Terri Piedra, NP 12/11/2020 11:09 AM

## 2020-12-16 ENCOUNTER — Ambulatory Visit: Payer: No Typology Code available for payment source | Attending: Critical Care Medicine

## 2020-12-16 DIAGNOSIS — Z20822 Contact with and (suspected) exposure to covid-19: Secondary | ICD-10-CM

## 2020-12-17 LAB — SARS-COV-2, NAA 2 DAY TAT

## 2020-12-17 LAB — NOVEL CORONAVIRUS, NAA: SARS-CoV-2, NAA: NOT DETECTED

## 2021-05-11 ENCOUNTER — Encounter: Payer: No Typology Code available for payment source | Admitting: Family

## 2021-06-13 ENCOUNTER — Ambulatory Visit: Payer: No Typology Code available for payment source | Admitting: Podiatry

## 2021-06-13 ENCOUNTER — Ambulatory Visit (INDEPENDENT_AMBULATORY_CARE_PROVIDER_SITE_OTHER): Payer: No Typology Code available for payment source

## 2021-06-13 ENCOUNTER — Other Ambulatory Visit: Payer: Self-pay

## 2021-06-13 ENCOUNTER — Ambulatory Visit
Admission: RE | Admit: 2021-06-13 | Discharge: 2021-06-13 | Disposition: A | Discharge status: Home / Self Care | Payer: No Typology Code available for payment source | Source: Ambulatory Visit | Attending: Podiatry | Admitting: Podiatry

## 2021-06-13 DIAGNOSIS — W19XXXA Unspecified fall, initial encounter: Secondary | ICD-10-CM | POA: Diagnosis not present

## 2021-06-13 DIAGNOSIS — S92001A Unspecified fracture of right calcaneus, initial encounter for closed fracture: Secondary | ICD-10-CM

## 2021-06-13 DIAGNOSIS — S99921A Unspecified injury of right foot, initial encounter: Secondary | ICD-10-CM

## 2021-06-13 NOTE — Patient Instructions (Signed)
You can go to [facility] today for the back x-ray. They will call you for the CT scan.  Wear CAM boot at all times.

## 2021-06-15 NOTE — Progress Notes (Signed)
Subjective:   Patient ID: Haley Moreno, female   DOB: 55 y.o.   MRN: [id number]   HPI 55 year old female presents the office with concerns of right foot injury.  She states that on Saturday she jumped into a ball pit and she landed on her heel and since then she has had discomfort not able to put weight on her heel.  She said no recent treatment.  She does have some chronic back lower back pain but she does report some mild increase in back pain since the injury.   Review of Systems  All other systems reviewed and are negative.  Past Medical History:  Diagnosis Date   Allergy    past hx - used to have allergy shots    Back pain    Back pain    Cataract    both eyes - forming    Glaucoma    Heart murmur    Heartburn    High cholesterol    Hypertension    Neck pain    Prediabetes    Vitamin D deficiency     Past Surgical History:  Procedure Laterality Date   CARPAL TUNNEL RELEASE Bilateral 02/2009   right hand   CARPAL TUNNEL RELEASE  03/2009   left hand   OOPHORECTOMY     Patient unsure of side     Current Outpatient Medications:    Rosuvastatin Calcium 10 MG CPSP, , Disp: , Rfl:    valsartan (DIOVAN) 160 MG tablet, , Disp: , Rfl:    amLODipine (NORVASC) 5 MG tablet, Take 1 tablet (5 mg total) by mouth daily., Disp: 30 tablet, Rfl: 0   ergocalciferol (VITAMIN D2) 1.25 MG (50000 UT) capsule, Take 1 capsule (50,000 Units total) by mouth once a week., Disp: 4 capsule, Rfl: 0   metFORMIN (GLUCOPHAGE) 500 MG tablet, Take 1 tablet (500 mg total) by mouth in the morning and at bedtime., Disp: 60 tablet, Rfl: 0   penicillin v potassium (VEETID) 500 MG tablet, Take 500 mg by mouth 4 (four) times daily., Disp: , Rfl:    valsartan-hydrochlorothiazide (DIOVAN-HCT) 160-12.5 MG tablet, Take 1 tablet by mouth every morning., Disp: , Rfl:   No Known Allergies       Objective:  Physical Exam  General: AAO x3, NAD  Dermatological: Skin is warm, dry and supple  bilateral.  There are no open sores, no preulcerative lesions, no rash or signs of infection present.  Vascular: Dorsalis Pedis artery and Posterior Tibial artery pedal pulses are 2/4 bilateral with immedate capillary fill time.  There is no pain with calf compression, swelling, warmth, erythema.   Neruologic: Grossly intact via light touch bilateral.   Musculoskeletal: There is tenderness palpation of lateral compression of calcaneus on the right side there is localized edema.  Tenderness to the plantar aspect calcaneus.  No significant tenderness in the Achilles tendon.  No other areas of pinpoint tenderness.      Assessment:   Right calcaneal fracture     Plan:  -Treatment options discussed including all alternatives, risks, and complications -Etiology of symptoms were discussed -X-rays were obtained and reviewed with the patient.  We will sensation of the calcaneus concerning for fracture.  CT ordered. -Recommended immobilization.  Order was given for crutches.  Dispensed cam boot.  Given her back pain will order x-ray of her lumbar spine to rule out any fracture.  Urged ice elevation.  Trula Slade DPM

## 2021-06-27 ENCOUNTER — Ambulatory Visit: Payer: No Typology Code available for payment source | Admitting: Podiatry

## 2021-06-28 ENCOUNTER — Other Ambulatory Visit: Payer: Self-pay

## 2021-06-28 ENCOUNTER — Ambulatory Visit
Admission: RE | Admit: 2021-06-28 | Discharge: 2021-06-28 | Disposition: A | Discharge status: Home / Self Care | Payer: No Typology Code available for payment source | Source: Ambulatory Visit | Attending: Podiatry | Admitting: Podiatry

## 2021-06-28 DIAGNOSIS — S92001A Unspecified fracture of right calcaneus, initial encounter for closed fracture: Secondary | ICD-10-CM

## 2021-07-10 ENCOUNTER — Other Ambulatory Visit: Payer: Self-pay | Admitting: Family

## 2021-07-10 DIAGNOSIS — Z1231 Encounter for screening mammogram for malignant neoplasm of breast: Secondary | ICD-10-CM

## 2021-07-11 ENCOUNTER — Other Ambulatory Visit: Payer: Self-pay

## 2021-07-11 ENCOUNTER — Ambulatory Visit: Payer: No Typology Code available for payment source | Admitting: Podiatry

## 2021-07-11 ENCOUNTER — Ambulatory Visit (INDEPENDENT_AMBULATORY_CARE_PROVIDER_SITE_OTHER): Payer: No Typology Code available for payment source

## 2021-07-11 ENCOUNTER — Encounter: Payer: Self-pay | Admitting: Podiatry

## 2021-07-11 DIAGNOSIS — S92001A Unspecified fracture of right calcaneus, initial encounter for closed fracture: Secondary | ICD-10-CM

## 2021-07-11 DIAGNOSIS — S9031XD Contusion of right foot, subsequent encounter: Secondary | ICD-10-CM | POA: Diagnosis not present

## 2021-07-12 NOTE — Progress Notes (Signed)
Subjective: 55 year old female presents the office today for evaluation of right heel injury.  She said that since last appointment she is feeling better but she still does have discomfort.  She is walking in the cam boot.  No recent injury or changes otherwise since I last saw her.  She has no new concerns today.  Objective: AAO x3, NAD DP/PT pulses palpable bilaterally, CRT less than 3 seconds There is still tenderness palpation to the right calcaneus.  There is tenderness palpation on compression.  Mild discomfort at the distal portion Achilles tendon as well as the plantar calcaneus on the insertion of plantar fascia.  Clinically the Achilles tendon, plantar fascial tear to be intact.  MMT 5/5.  There is minimal edema.  There is no erythema or warmth and there is no open lesions. No pain with calf compression, swelling, warmth, erythema  Assessment: Contusion right calcaneus  Plan: -All treatment options discussed with the patient including all alternatives, risks, complications.  -Again reviewed the CT scan.  No acute fracture no osteochondral lesion are noted.  Arthritic changes are present. -Reviewed the x-rays today.  There is some edema present the calcaneus but no obvious fracture line. -At this point I want her to continue the cam boot for at least 2 more weeks before starting to transition to regular shoe.  Continue to ice and elevate.  Anti-inflammatories as needed. -If no improvement recommend MRI but since she is improving we will hold off on this. -Patient encouraged to call the office with any questions, concerns, change in symptoms.   Trula Slade DPM

## 2021-08-03 ENCOUNTER — Ambulatory Visit: Payer: No Typology Code available for payment source | Admitting: Podiatry

## 2021-08-11 ENCOUNTER — Other Ambulatory Visit: Payer: Self-pay

## 2021-08-11 ENCOUNTER — Ambulatory Visit
Admission: RE | Admit: 2021-08-11 | Discharge: 2021-08-11 | Disposition: A | Discharge status: Home / Self Care | Payer: No Typology Code available for payment source | Source: Ambulatory Visit | Attending: Family | Admitting: Family

## 2021-08-11 DIAGNOSIS — Z1231 Encounter for screening mammogram for malignant neoplasm of breast: Secondary | ICD-10-CM

## 2021-08-17 ENCOUNTER — Other Ambulatory Visit: Payer: Self-pay | Admitting: Family

## 2021-08-17 DIAGNOSIS — R928 Other abnormal and inconclusive findings on diagnostic imaging of breast: Secondary | ICD-10-CM

## 2021-09-19 ENCOUNTER — Ambulatory Visit
Admission: RE | Admit: 2021-09-19 | Discharge: 2021-09-19 | Disposition: A | Discharge status: Home / Self Care | Payer: No Typology Code available for payment source | Source: Ambulatory Visit | Attending: Family | Admitting: Family

## 2021-09-19 DIAGNOSIS — R928 Other abnormal and inconclusive findings on diagnostic imaging of breast: Secondary | ICD-10-CM

## 2021-09-21 ENCOUNTER — Ambulatory Visit: Payer: No Typology Code available for payment source | Admitting: Podiatry

## 2021-09-21 ENCOUNTER — Other Ambulatory Visit: Payer: Self-pay

## 2021-09-21 DIAGNOSIS — M722 Plantar fascial fibromatosis: Secondary | ICD-10-CM | POA: Diagnosis not present

## 2021-09-21 DIAGNOSIS — S9031XD Contusion of right foot, subsequent encounter: Secondary | ICD-10-CM

## 2021-09-21 NOTE — Patient Instructions (Signed)

## 2021-09-26 ENCOUNTER — Other Ambulatory Visit: Payer: Self-pay

## 2021-09-26 DIAGNOSIS — M722 Plantar fascial fibromatosis: Secondary | ICD-10-CM

## 2021-09-26 DIAGNOSIS — S9031XD Contusion of right foot, subsequent encounter: Secondary | ICD-10-CM

## 2021-09-26 NOTE — Progress Notes (Signed)
Subjective: 56 year old female presents the office today for evaluation of right heel injury.  She does state that she is doing much better but she still gets some intermittent discomfort of the heel on the right side.  She presents today wearing a regular shoe.  No new injuries or concerns since I last saw her.   Objective: AAO x3, NAD DP/PT pulses palpable bilaterally, CRT less than 3 seconds Today there is tenderness palpation on the plantar aspect calcaneus and insertion of plantar fascia.  There is no significant pain with lateral compression calcaneus there is no edema.  No pain with Achilles tendon.  Flexor, extensor tendons appear intact.  No significant edema. No pain with calf compression, swelling, warmth, erythema  Assessment: Contusion right calcaneus, chronic fasciitis  Plan: -All treatment options discussed with the patient including all alternatives, risks, complications.  -Overall she is doing better but having more plantar fascial symptoms.  This time will refer to physical therapy and a prescription for [facility] was written today.  Meantime continue with shoes and good arch supports, home stretching, icing daily.  Anti-inflammatories as needed.  Return in about 4 weeks (around 10/19/2021).  Trula Slade DPM

## 2021-10-19 ENCOUNTER — Ambulatory Visit: Payer: No Typology Code available for payment source | Admitting: Podiatry

## 2022-03-21 ENCOUNTER — Encounter (INDEPENDENT_AMBULATORY_CARE_PROVIDER_SITE_OTHER): Payer: Self-pay

## 2022-06-14 IMAGING — MG MM DIGITAL DIAGNOSTIC UNILAT*L* W/ TOMO W/ CAD
6 of 9 series · 6 of 21 positions shown · non-contrast
Comparison: + previous exams including recent screening mammogram
dated 08/11/2021 and LEFT breast ultrasound dated 12/04/2016.

CLINICAL DATA: Patient returns today to evaluate a possible mass
within the upper-outer quadrant of the LEFT breast questioned on
recent screening mammogram. Patient also returns today to evaluate
calcifications within the inner and outer LEFT breast, also
identified on the recent screening mammogram.

EXAM:
DIGITAL DIAGNOSTIC UNILATERAL LEFT MAMMOGRAM WITH TOMOSYNTHESIS AND
CAD; ULTRASOUND LEFT BREAST LIMITED
TECHNIQUE: Left digital diagnostic mammography and breast tomosynthesis was
performed. The images were evaluated with computer-aided detection.;
Targeted ultrasound examination of the left breast was performed.

[L CC (1 of 2)]
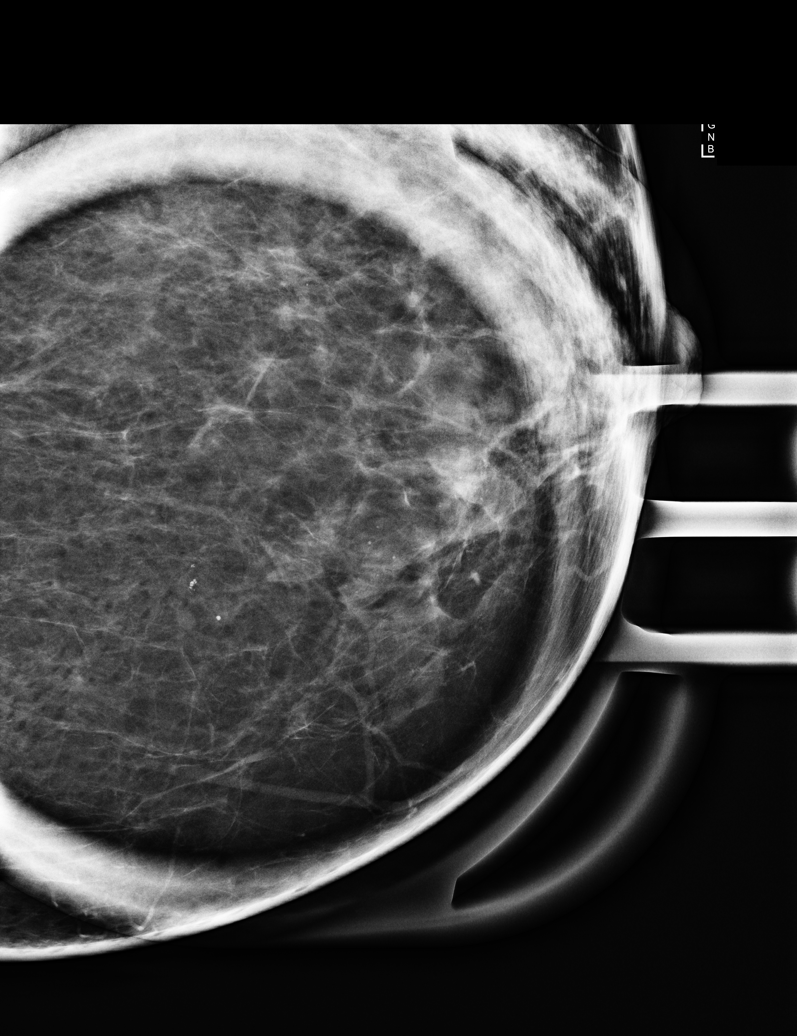

[L ML]
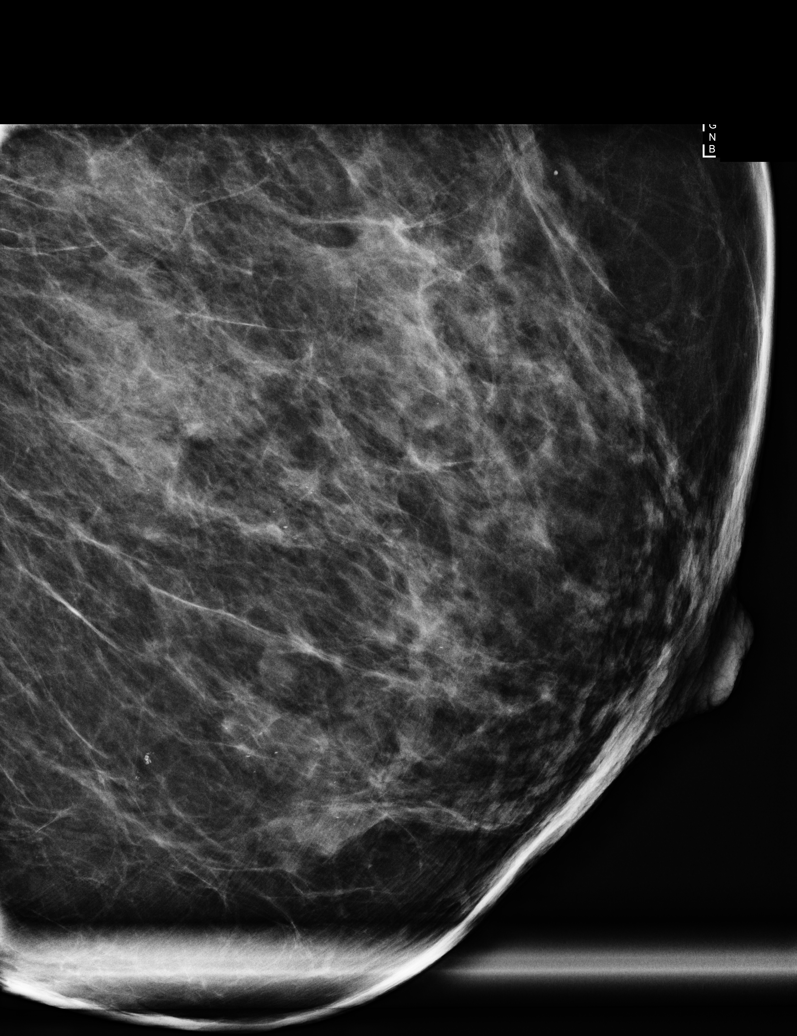

[L CC (2 of 2)]
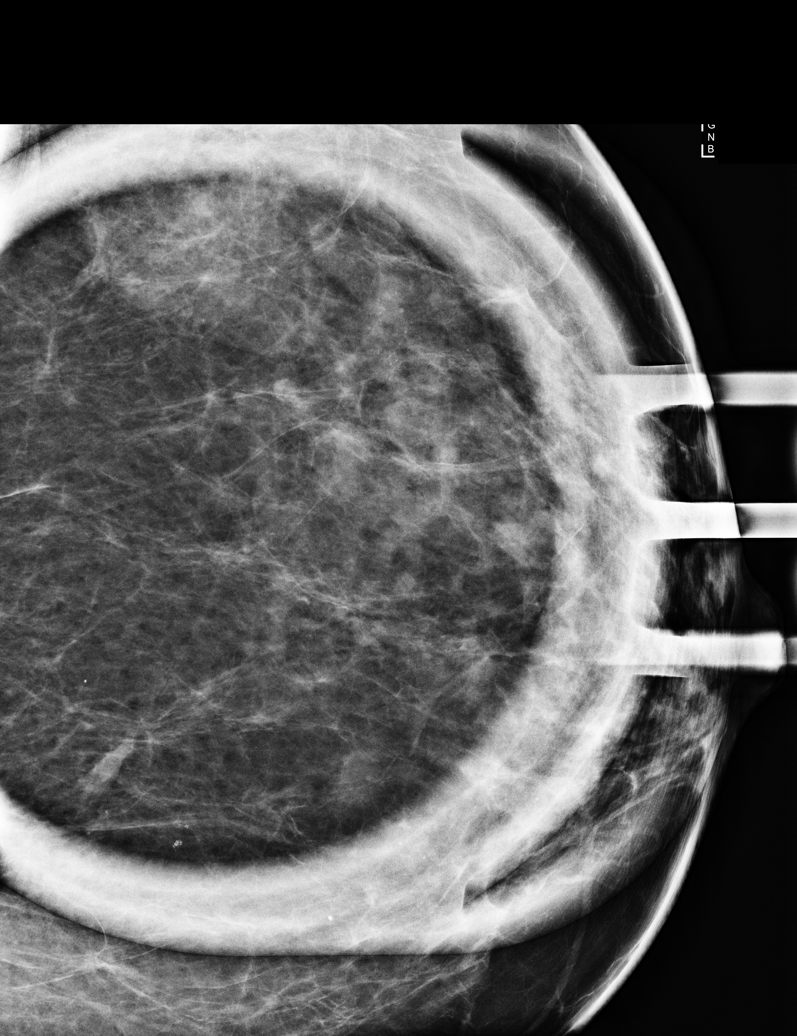

[L ML synth-2D]
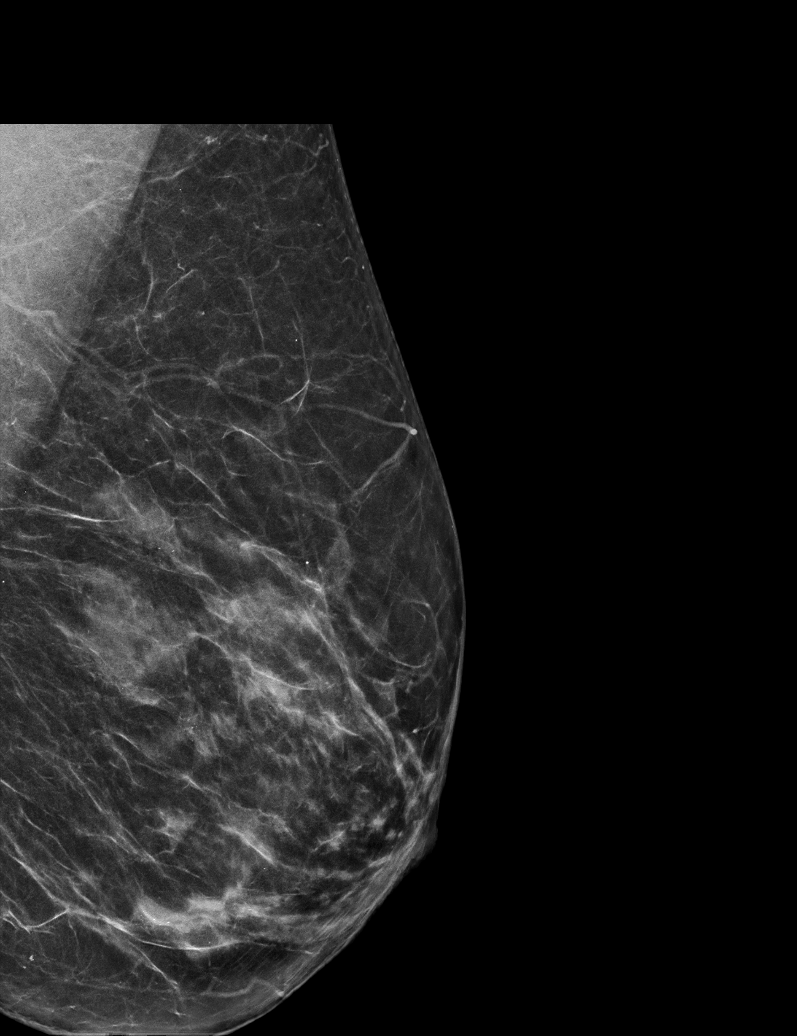

[L MLO synth-2D]
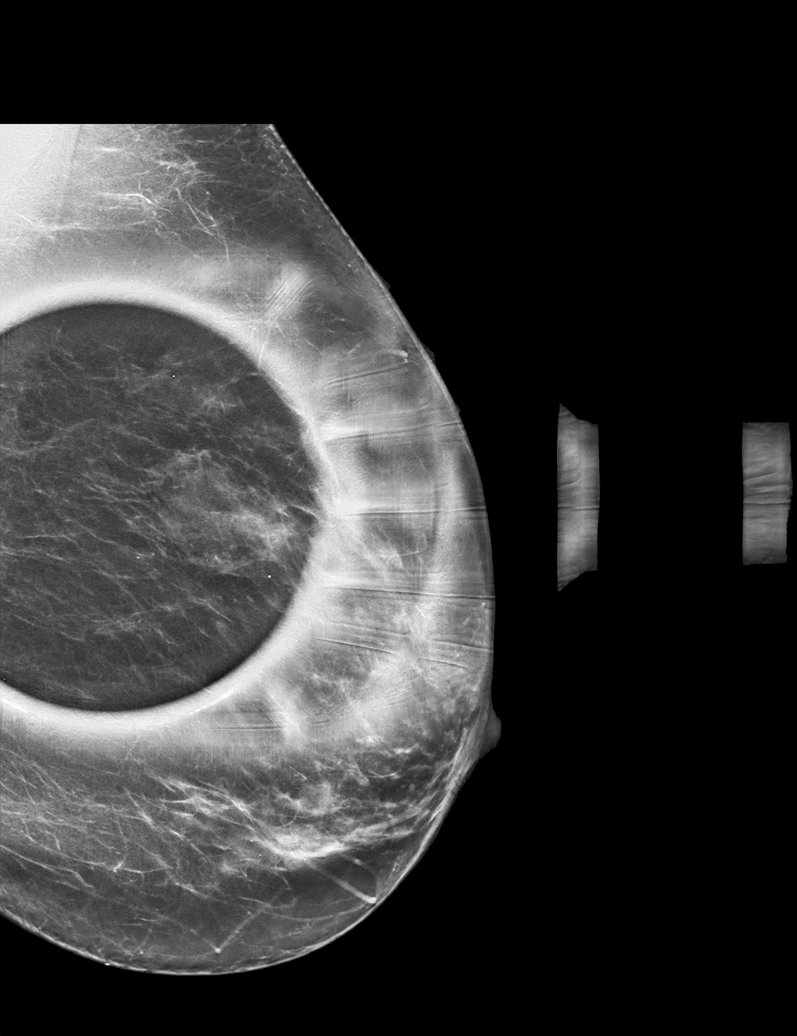

[L CC synth-2D]
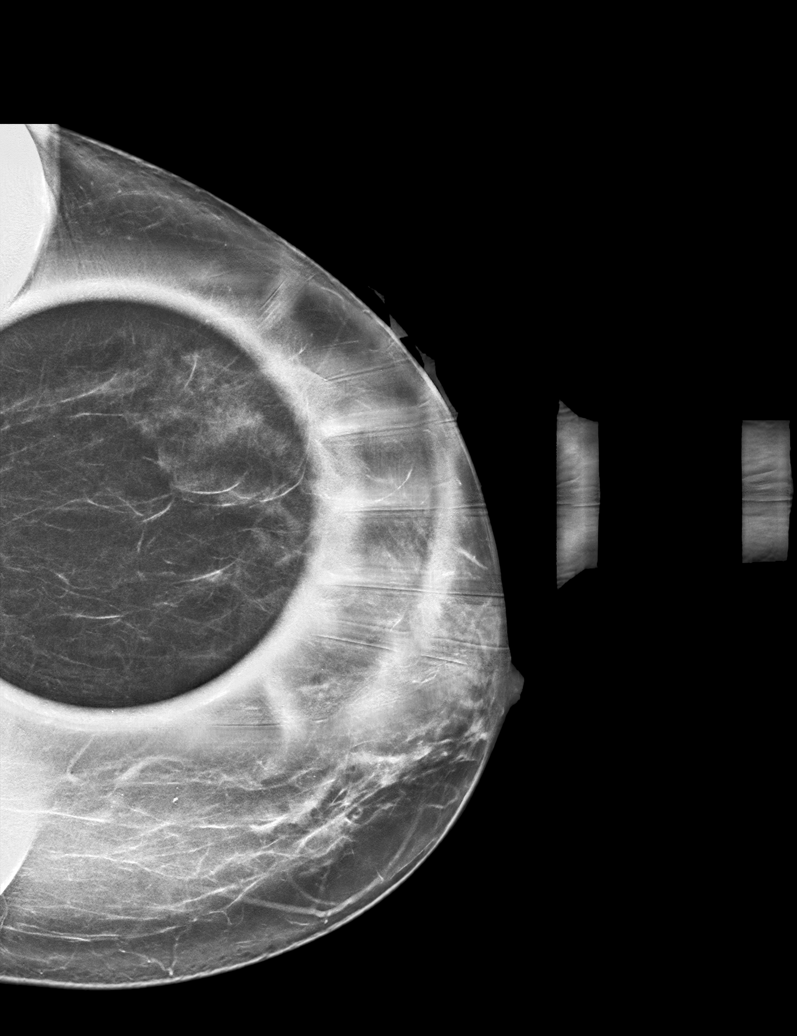

[6 of 21 positions shown; findings below may reference images not displayed]

ACR Breast Density Category b: There are scattered areas of
fibroglandular density.
FINDINGS: Scattered punctate and round calcifications are confirmed within the
inner and outer LEFT breast, majority of which demonstrate a
layering appearance indicating benign milk of calcium/fibrocystic
change. There are no suspicious pleomorphic or fine linear branching
calcifications.

Multiple partially obscured masses are confirmed within the
upper-outer quadrant of the LEFT breast. Ultrasound will be
performed for further characterization.

Targeted ultrasound is performed, again showing fibrocystic changes
within the LEFT breast from the 1 to 3 o'clock axes, 3-5 cm from the
nipple, corresponding to the mammographic findings. No suspicious
solid or cystic mass is identified by ultrasound.
IMPRESSION: No evidence of malignancy. Benign fibrocystic changes within the
outer LEFT breast. Benign-appearing calcifications within the outer
and inner LEFT breast, majority demonstrating a layering appearance
indicating benign milk of calcium/fibrocystic change.

Patient may return to routine annual bilateral screening mammogram
schedule.

RECOMMENDATION:
Screening mammogram in one year.(Code:TO-U-5SD)

I have discussed the findings and recommendations with the patient.
If applicable, a reminder letter will be sent to the patient
regarding the next appointment.

BI-RADS CATEGORY  2: Benign.

## 2022-07-16 DIAGNOSIS — L219 Seborrheic dermatitis, unspecified: Secondary | ICD-10-CM | POA: Insufficient documentation

## 2022-07-16 DIAGNOSIS — L658 Other specified nonscarring hair loss: Secondary | ICD-10-CM | POA: Insufficient documentation

## 2022-12-05 ENCOUNTER — Other Ambulatory Visit: Payer: Self-pay | Admitting: Family Medicine

## 2022-12-05 DIAGNOSIS — K819 Cholecystitis, unspecified: Secondary | ICD-10-CM

## 2022-12-24 ENCOUNTER — Ambulatory Visit
Admission: RE | Admit: 2022-12-24 | Discharge: 2022-12-24 | Disposition: A | Discharge status: Home / Self Care | Payer: No Typology Code available for payment source | Source: Ambulatory Visit | Attending: Family Medicine | Admitting: Family Medicine

## 2022-12-24 DIAGNOSIS — K819 Cholecystitis, unspecified: Secondary | ICD-10-CM

## 2022-12-26 ENCOUNTER — Other Ambulatory Visit: Payer: No Typology Code available for payment source

## 2023-05-24 ENCOUNTER — Other Ambulatory Visit: Payer: Self-pay | Admitting: Family Medicine

## 2023-05-24 ENCOUNTER — Ambulatory Visit
Admission: RE | Admit: 2023-05-24 | Discharge: 2023-05-24 | Disposition: A | Discharge status: Home / Self Care | Payer: No Typology Code available for payment source | Source: Ambulatory Visit | Attending: Family Medicine | Admitting: Family Medicine

## 2023-05-24 DIAGNOSIS — J9801 Acute bronchospasm: Secondary | ICD-10-CM

## 2024-03-19 NOTE — Progress Notes (Unsigned)
 Office Visit Note  Patient: Haley Moreno             Date of Birth: May 18, 1966           MRN: [id number]             PCP: Benjamine Aland, MD Referring: Shari Sieving, MD Visit Date: 03/20/2024 Occupation: @GUAROCC @  Subjective:  No chief complaint on file.   History of Present Illness: Haley Moreno is a 58 y.o. female ***     Activities of Daily Living:  Patient reports morning stiffness for *** {minute/hour:19697}.   Patient {ACTIONS;DENIES/REPORTS:21021675::Denies} nocturnal pain.  Difficulty dressing/grooming: {ACTIONS;DENIES/REPORTS:21021675::Denies} Difficulty climbing stairs: {ACTIONS;DENIES/REPORTS:21021675::Denies} Difficulty getting out of chair: {ACTIONS;DENIES/REPORTS:21021675::Denies} Difficulty using hands for taps, buttons, cutlery, and/or writing: {ACTIONS;DENIES/REPORTS:21021675::Denies}  No Rheumatology ROS completed.   PMFS History:  Patient Active Problem List   Diagnosis Date Noted   Class 1 obesity with serious comorbidity and body mass index (BMI) of 32.0 to 32.9 in adult 06/10/2020   Other fatigue 05/12/2020   SOB (shortness of breath) on exertion 05/12/2020   Essential hypertension 05/12/2020   Hyperglycemia 05/12/2020   Hyperlipidemia 05/12/2020   Vitamin D  deficiency 05/12/2020   At risk for diabetes mellitus 05/12/2020   Right foot pain 08/15/2018   Acute pain of right shoulder 07/18/2018   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   Prediabetes 12/23/2013   Essential hypertension, benign 12/23/2013    Past Medical History:  Diagnosis Date   Allergy    past hx - used to have allergy shots    Back pain    Back pain    Cataract    both eyes - forming    Glaucoma    Heart murmur    Heartburn    High cholesterol    Hypertension    Neck pain    Prediabetes    Vitamin D  deficiency     Family History  Problem Relation Age of Onset   Hypertension Father    Hyperlipidemia Father    Liver  disease Father    Alcoholism Father    Asthma Daughter    Asthma Son    Colon cancer Neg Hx    Colon polyps Neg Hx    Esophageal cancer Neg Hx    Rectal cancer Neg Hx    Stomach cancer Neg Hx    Past Surgical History:  Procedure Laterality Date   CARPAL TUNNEL RELEASE Bilateral 02/2009   right hand   CARPAL TUNNEL RELEASE  03/2009   left hand   OOPHORECTOMY     Patient unsure of side   Social History   Social History Narrative   Not on file   Immunization History  Administered Date(s) Administered   Influenza,inj,Quad PF,6+ Mos 05/04/2020   PFIZER(Purple Top)SARS-COV-2 Vaccination 11/12/2019, 12/09/2019   Tdap 08/15/2018     Objective: Vital Signs: LMP 10/29/2018 Comment: Irregular- last 3-4 months ago    Physical Exam   Musculoskeletal Exam: ***  CDAI Exam: CDAI Score: -- Patient Global: --; Provider Global: -- Swollen: --; Tender: -- Joint Exam 03/20/2024   No joint exam has been documented for this visit   There is currently no information documented on the homunculus. Go to the Rheumatology activity and complete the homunculus joint exam.  Investigation: No additional findings.  Imaging: No results found.  Recent Labs: Lab Results  Component Value Date   WBC 5.0 05/12/2020   HGB 15.2 05/12/2020   PLT 294 05/12/2020   NA 136 05/12/2020  K 4.6 05/12/2020   CL 99 05/12/2020   CO2 22 05/12/2020   GLUCOSE 84 05/12/2020   BUN 12 05/12/2020   CREATININE 0.92 05/12/2020   BILITOT 0.3 05/12/2020   ALKPHOS 60 05/12/2020   AST 17 05/12/2020   ALT 27 05/12/2020   PROT 7.9 05/12/2020   ALBUMIN 4.8 05/12/2020   CALCIUM 9.8 05/12/2020   GFRAA 82 05/12/2020    Speciality Comments: No specialty comments available.  Procedures:  No procedures performed Allergies: Patient has no known allergies.   Assessment / Plan:     Visit Diagnoses: No diagnosis found.  Orders: No orders of the defined types were placed in this encounter.  No orders of  the defined types were placed in this encounter.   Face-to-face time spent with patient was *** minutes. Greater than 50% of time was spent in counseling and coordination of care.  Follow-Up Instructions: No follow-ups on file.   Lonni LELON Ester, MD  Note - This record has been created using [employer].  Chart creation errors have been sought, but may not always  have been located. Such creation errors do not reflect on  the standard of medical care.

## 2024-03-20 ENCOUNTER — Encounter: Payer: Self-pay | Admitting: Internal Medicine

## 2024-03-20 ENCOUNTER — Ambulatory Visit: Attending: Internal Medicine | Admitting: Internal Medicine

## 2024-03-20 VITALS — BP 93/63 | HR 73 | Resp 14 | Ht 61.75 in | Wt 134.0 lb

## 2024-03-20 DIAGNOSIS — M7918 Myalgia, other site: Secondary | ICD-10-CM | POA: Diagnosis not present

## 2024-03-20 DIAGNOSIS — R768 Other specified abnormal immunological findings in serum: Secondary | ICD-10-CM

## 2024-03-20 DIAGNOSIS — G8929 Other chronic pain: Secondary | ICD-10-CM | POA: Diagnosis not present

## 2024-03-20 DIAGNOSIS — M25562 Pain in left knee: Secondary | ICD-10-CM

## 2024-03-20 MED ORDER — CYCLOBENZAPRINE HCL 10 MG PO TABS: 10.0000 mg | ORAL_TABLET | Freq: Every evening | ORAL | 0 refills | Status: DC | PRN

## 2024-03-20 NOTE — Patient Instructions (Addendum)
 I recommend checking out the [facility]  patient-centered guide for fibromyalgia and chronic pain management: [email or web address]   Knee Exercises Ask your health care provider which exercises are safe for you. Do exercises exactly as told by your health care provider and adjust them as directed. It is normal to feel mild stretching, pulling, tightness, or discomfort as you do these exercises. Stop right away if you feel sudden pain or your pain gets worse. Do not begin these exercises until told by your health care provider. Stretching and range-of-motion exercises These exercises warm up your muscles and joints and improve the movement and flexibility of your knee. These exercises also help to relieve pain and swelling. Knee extension, prone  Lie on your abdomen (prone position) on a bed. Place your left / right knee just beyond the edge of the surface so your knee is not on the bed. You can put a towel under your left / right thigh just above your kneecap for comfort. Relax your leg muscles and allow gravity to straighten your knee (extension). You should feel a stretch behind your left / right knee. Hold this position for __________ seconds. Scoot up so your knee is supported between repetitions. Repeat __________ times. Complete this exercise __________ times a day. Knee flexion, active  Lie on your back with both legs straight. If this causes back discomfort, bend your left / right knee so your foot is flat on the floor. Slowly slide your left / right heel back toward your buttocks. Stop when you feel a gentle stretch in the front of your knee or thigh (flexion). Hold this position for __________ seconds. Slowly slide your left / right heel back to the starting position. Repeat __________ times. Complete this exercise __________ times a day. Quadriceps stretch, prone  Lie on your abdomen on a firm surface, such as a bed or padded floor. Bend your left / right knee  and hold your ankle. If you cannot reach your ankle or pant leg, loop a belt around your foot and grab the belt instead. Gently pull your heel toward your buttocks. Your knee should not slide out to the side. You should feel a stretch in the front of your thigh and knee (quadriceps). Hold this position for __________ seconds. Repeat __________ times. Complete this exercise __________ times a day. Hamstring, supine  Lie on your back (supine position). Loop a belt or towel over the ball of your left / right foot. The ball of your foot is on the walking surface, right under your toes. Straighten your left / right knee and slowly pull on the belt to raise your leg until you feel a gentle stretch behind your knee (hamstring). Do not let your knee bend while you do this. Keep your other leg flat on the floor. Hold this position for __________ seconds. Repeat __________ times. Complete this exercise __________ times a day. Strengthening exercises These exercises build strength and endurance in your knee. Endurance is the ability to use your muscles for a long time, even after they get tired. Quadriceps, isometric This exercise strengthens the muscles in front of your thigh (quadriceps) without moving your knee joint (isometric). Lie on your back with your left / right leg extended and your other knee bent. Put a rolled towel or small pillow under your knee if told by your health care provider. Slowly tense the muscles in the front of your left / right thigh. You should see your kneecap slide up toward your hip or  see increased dimpling just above the knee. This motion will push the back of the knee toward the floor. For __________ seconds, hold the muscle as tight as you can without increasing your pain. Relax the muscles slowly and completely. Repeat __________ times. Complete this exercise __________ times a day. Straight leg raises This exercise strengthens the muscles in front of your thigh  (quadriceps) and the muscles that move your hips (hip flexors). Lie on your back with your left / right leg extended and your other knee bent. Tense the muscles in the front of your left / right thigh. You should see your kneecap slide up or see increased dimpling just above the knee. Your thigh may even shake a bit. Keep these muscles tight as you raise your leg 4-6 inches (10-15 cm) off the floor. Do not let your knee bend. Hold this position for __________ seconds. Keep these muscles tense as you lower your leg. Relax your muscles slowly and completely after each repetition. Repeat __________ times. Complete this exercise __________ times a day. Hamstring, isometric  Lie on your back on a firm surface. Bend your left / right knee about __________ degrees. Dig your left / right heel into the surface as if you are trying to pull it toward your buttocks. Tighten the muscles in the back of your thighs (hamstring) to dig as hard as you can without increasing any pain. Hold this position for __________ seconds. Release the tension gradually and allow your muscles to relax completely for __________ seconds after each repetition. Repeat __________ times. Complete this exercise __________ times a day. Hamstring curls If told by your health care provider, do this exercise while wearing ankle weights. Begin with __________lb / kg weights. Then increase the weight by 1 lb (0.5 kg) increments. Do not wear ankle weights that are more than __________lb / kg. Lie on your abdomen with your legs straight. Bend your left / right knee as far as you can without feeling pain. Keep your hips flat against the floor. Hold this position for __________ seconds. Slowly lower your leg to the starting position. Repeat __________ times. Complete this exercise __________ times a day. Squats This exercise strengthens the muscles in front of your thigh and knee (quadriceps). Stand in front of a table, with your feet and  knees pointing straight ahead. You may rest your hands on the table for balance but not for support. Slowly bend your knees and lower your hips like you are going to sit in a chair. Keep your weight over your heels, not over your toes. Keep your lower legs upright so they are parallel with the table legs. Do not let your hips go lower than your knees. Do not bend lower than told by your health care provider. If your knee pain increases, do not bend as low. Hold the squat position for __________ seconds. Slowly push with your legs to return to standing. Do not use your hands to pull yourself to standing. Repeat __________ times. Complete this exercise __________ times a day. Wall slides This exercise strengthens the muscles in front of your thigh and knee (quadriceps). Lean your back against a smooth wall or door, and walk your feet out 18-24 inches (46-61 cm) from it. Place your feet hip-width apart. Slowly slide down the wall or door until your knees bend __________ degrees. Keep your knees over your heels, not over your toes. Keep your knees in line with your hips. Hold this position for __________ seconds. Repeat __________  times. Complete this exercise __________ times a day. Straight leg raises, side-lying This exercise strengthens the muscles that rotate the leg at the hip and move it away from your body (hip abductors). Lie on your side with your left / right leg in the top position. Lie so your head, shoulder, knee, and hip line up. You may bend your bottom knee to help you keep your balance. Roll your hips slightly forward so your hips are stacked directly over each other and your left / right knee is facing forward. Leading with your heel, lift your top leg 4-6 inches (10-15 cm). You should feel the muscles in your outer hip lifting. Do not let your foot drift forward. Do not let your knee roll toward the ceiling. Hold this position for __________ seconds. Slowly return your leg to  the starting position. Let your muscles relax completely after each repetition. Repeat __________ times. Complete this exercise __________ times a day. Straight leg raises, prone This exercise stretches the muscles that move your hips away from the front of the pelvis (hip extensors). Lie on your abdomen on a firm surface. You can put a pillow under your hips if that is more comfortable. Tense the muscles in your buttocks and lift your left / right leg about 4-6 inches (10-15 cm). Keep your knee straight as you lift your leg. Hold this position for __________ seconds. Slowly lower your leg to the starting position. Let your leg relax completely after each repetition. Repeat __________ times. Complete this exercise __________ times a day.

## 2024-03-21 LAB — SJOGRENS SYNDROME-A EXTRACTABLE NUCLEAR ANTIBODY: SSA (Ro) (ENA) Antibody, IgG: <1 AI

## 2024-03-21 LAB — ANTI-SMITH ANTIBODY: ENA SM Ab Ser-aCnc: <1 AI

## 2024-03-21 LAB — C3 AND C4
C3 Complement: 97 mg/dL (ref 83–193)
C4 Complement: 7 mg/dL — ABNORMAL LOW (ref 15–57)

## 2024-03-21 LAB — SJOGRENS SYNDROME-B EXTRACTABLE NUCLEAR ANTIBODY: SSB (La) (ENA) Antibody, IgG: <1 AI

## 2024-03-21 LAB — ANTI-DNA ANTIBODY, DOUBLE-STRANDED: ds DNA Ab: 2 [IU]/mL

## 2024-03-21 LAB — RNP ANTIBODY: Ribonucleic Protein(ENA) Antibody, IgG: <1 AI

## 2024-03-30 ENCOUNTER — Encounter

## 2024-03-31 ENCOUNTER — Encounter: Payer: Self-pay | Admitting: Orthopedic Surgery

## 2024-04-19 ENCOUNTER — Other Ambulatory Visit: Payer: Self-pay | Admitting: Internal Medicine

## 2024-04-19 DIAGNOSIS — M7918 Myalgia, other site: Secondary | ICD-10-CM

## 2024-04-20 NOTE — Telephone Encounter (Signed)
 Last Fill: 03/20/2024  Next Visit: 04/29/2024  Last Visit: 03/20/2024  Dx: Myofascial pain   Current Dose per office note on 03/20/2024: Flexeril  10 mg at night as needed   Okay to refill Flexeril ?

## 2024-04-29 ENCOUNTER — Ambulatory Visit: Admitting: Internal Medicine

## 2024-04-29 NOTE — Progress Notes (Deleted)
 Office Visit Note  Patient: Haley Moreno             Date of Birth: November 15, 1965           MRN: [id number]             PCP: Benjamine Aland, MD Referring: Benjamine Aland, MD Visit Date: 04/29/2024   Subjective:  No chief complaint on file.   History of Present Illness: Haley Moreno is a 58 y.o. female here for follow up ***   Previous HPI 03/20/24 Haley Moreno is a 59 year old female who presents with joint pain and a positive ANA test.  She has been experiencing significant joint pain in multiple areas, reports particularly increased since January. Worst pain is in her spine, radiating down to her legs, particularly affecting her knee and shin and worse on the left side. She also describes numbness in her feet and hands. The pain has worsened over time, impacting her ability to engage in activities such as walking at the park. She describes the pain as severe, requiring her to stop while shopping, and notes that she used to be very active but now struggles to complete even one lap around the park. The pain is described as 'on fire' when touched over the involved areas.  She has been experiencing swelling in her knee and uses a heating pad nightly for comfort, although it does not alleviate the pain. She mentions mouth sores, which she attributes to dental issues. She reports hair loss, particularly thinning in the middle of her scalp, and is currently taking finasteride and minoxidil for this condition. She also uses minoxidil daily for hair loss but reports no improvement in her joint pain from this treatment.  She has been taking meloxicam daily for a month for joint pain, with no perceived benefit, and previously used Advil, which only helped after taking four to five pills. She experiences sleep disturbances due to pain, leading to poor sleep quality. She was never diagnosed with sleep apnea, but states she used to snore before she lost weight.  She has been  experiencing headaches, which she attributes to anxiety and stress related to her current health issues. No bowel problems are reported. She notes memory and concentration difficulties ongoing since COVID-19. She has not been on any muscle relaxers or anxiety medications in the past.   She gets occasional ulcers or sores in the mouth that she attributes to her teeth.  No nasal ulcers.  Has seborrheic dermatitis on the scalp that is chronic but no inflammatory or sun sensitive rashes.  Has not noticed any cervical or axillary lymphadenopathy.  No Raynaud's symptoms.     Labs reviewed 01/2024 ANA 1:80 speckled ESR 2 CRP <3 RF negative   No Rheumatology ROS completed.   PMFS History:  Patient Active Problem List   Diagnosis Date Noted   Positive ANA (antinuclear antibody) 03/20/2024   Pain in left knee 03/20/2024   Myofascial pain 03/20/2024   Female pattern hair loss 07/16/2022   Seborrheic dermatitis of scalp 07/16/2022   Class 1 obesity with serious comorbidity and body mass index (BMI) of 32.0 to 32.9 in adult 06/10/2020   Other fatigue 05/12/2020   SOB (shortness of breath) on exertion 05/12/2020   Essential hypertension 05/12/2020   Hyperglycemia 05/12/2020   Hyperlipidemia 05/12/2020   Vitamin D  deficiency 05/12/2020   At risk for diabetes mellitus 05/12/2020   Right foot pain 08/15/2018   Acute pain of right shoulder  07/18/2018   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   Prediabetes 12/23/2013   Essential hypertension, benign 12/23/2013    Past Medical History:  Diagnosis Date   Allergy    past hx - used to have allergy shots    Back pain    Back pain    Cataract    both eyes - forming    Glaucoma    Heart murmur    Heartburn    High cholesterol    Hypertension    Neck pain    Prediabetes    Vitamin D  deficiency     Family History  Problem Relation Age of Onset   Hypertension Father    Hyperlipidemia Father    Liver disease Father    Alcoholism  Father    Asthma Daughter    Asthma Son    Colon cancer Neg Hx    Colon polyps Neg Hx    Esophageal cancer Neg Hx    Rectal cancer Neg Hx    Stomach cancer Neg Hx    Past Surgical History:  Procedure Laterality Date   CARPAL TUNNEL RELEASE Bilateral 02/2009   right hand   CARPAL TUNNEL RELEASE  03/2009   left hand   OOPHORECTOMY     Patient unsure of side   Social History   Social History Narrative   Not on file   Immunization History  Administered Date(s) Administered   Influenza,inj,Quad PF,6+ Mos 05/04/2020   PFIZER(Purple Top)SARS-COV-2 Vaccination 11/12/2019, 12/09/2019   Tdap 08/15/2018     Objective: Vital Signs: LMP 10/29/2018 Comment: Irregular- last 3-4 months ago    Physical Exam   Musculoskeletal Exam: ***  CDAI Exam: CDAI Score: -- Patient Global: --; Provider Global: -- Swollen: --; Tender: -- Joint Exam 04/29/2024   No joint exam has been documented for this visit   There is currently no information documented on the homunculus. Go to the Rheumatology activity and complete the homunculus joint exam.  Investigation: No additional findings.  Imaging: No results found.  Recent Labs: Lab Results  Component Value Date   WBC 5.0 05/12/2020   HGB 15.2 05/12/2020   PLT 294 05/12/2020   NA 136 05/12/2020   K 4.6 05/12/2020   CL 99 05/12/2020   CO2 22 05/12/2020   GLUCOSE 84 05/12/2020   BUN 12 05/12/2020   CREATININE 0.92 05/12/2020   BILITOT 0.3 05/12/2020   ALKPHOS 60 05/12/2020   AST 17 05/12/2020   ALT 27 05/12/2020   PROT 7.9 05/12/2020   ALBUMIN 4.8 05/12/2020   CALCIUM 9.8 05/12/2020   GFRAA 82 05/12/2020    Speciality Comments: No specialty comments available.  Procedures:  No procedures performed Allergies: Patient has no known allergies.   Assessment / Plan:     Visit Diagnoses: No diagnosis found.  ***  Orders: No orders of the defined types were placed in this encounter.  No orders of the defined types were  placed in this encounter.    Follow-Up Instructions: No follow-ups on file.   Lonni LELON Ester, MD  Note - This record has been created using [employer].  Chart creation errors have been sought, but may not always  have been located. Such creation errors do not reflect on  the standard of medical care.

## 2024-05-07 ENCOUNTER — Ambulatory Visit: Attending: Internal Medicine | Admitting: Internal Medicine

## 2024-05-07 ENCOUNTER — Encounter: Payer: Self-pay | Admitting: Internal Medicine

## 2024-05-07 VITALS — BP 90/61 | HR 82 | Temp 97.6°F | Resp 14 | Ht 62.0 in | Wt 132.0 lb

## 2024-05-07 DIAGNOSIS — G8929 Other chronic pain: Secondary | ICD-10-CM

## 2024-05-07 DIAGNOSIS — M7918 Myalgia, other site: Secondary | ICD-10-CM

## 2024-05-07 DIAGNOSIS — M25562 Pain in left knee: Secondary | ICD-10-CM

## 2024-05-07 MED ORDER — DULOXETINE HCL 30 MG PO CPEP: 30.0000 mg | ORAL_CAPSULE | Freq: Every day | ORAL | 1 refills | Status: DC

## 2024-05-07 NOTE — Progress Notes (Signed)
 Office Visit Note  Patient: Haley Moreno             Date of Birth: July 04, 1966           MRN: [id number]             PCP: Benjamine Aland, MD Referring: Benjamine Aland, MD Visit Date: 05/07/2024   Subjective:  Pain (Patient states she has pain in her lower back. Patient states tomorrow she is getting an injection in her spine. )  Discussed the use of AI scribe software for clinical note transcription with the patient, who gave verbal consent to proceed.  History of Present Illness   Haley Moreno is a 58 year old female with osteoarthritis and degenerative disc disease who presents for follow-up regarding back pain management.  She is preparing to receive another injection in her back, similar to a previous treatment. The first injection was aimed towards her leg, which provided some relief, allowing her to walk into stores like [employer], although she still experiences significant pain in her back.  She has a history of a positive ANA test with a low titer of 1:80, but subsequent antibody tests for rheumatoid arthritis and lupus were negative.  She tried Flexeril  at night but did not notice any improvement in her symptoms and has since discontinued its use.  She experiences pain in her back that radiates to other areas, and she describes certain areas as 'painful' when pressed, which can cause shooting pain away from the site.  No history of mood disorders. No current symptoms of fluid retention or weight gain.        Previous HPI 03/20/24 Haley Moreno is a 58 year old female who presents with joint pain and a positive ANA test.  She has been experiencing significant joint pain in multiple areas, reports particularly increased since January. Worst pain is in her spine, radiating down to her legs, particularly affecting her knee and shin and worse on the left side. She also describes numbness in her feet and hands. The pain has worsened over time, impacting  her ability to engage in activities such as walking at the park. She describes the pain as severe, requiring her to stop while shopping, and notes that she used to be very active but now struggles to complete even one lap around the park. The pain is described as 'on fire' when touched over the involved areas.  She has been experiencing swelling in her knee and uses a heating pad nightly for comfort, although it does not alleviate the pain. She mentions mouth sores, which she attributes to dental issues. She reports hair loss, particularly thinning in the middle of her scalp, and is currently taking finasteride and minoxidil for this condition. She also uses minoxidil daily for hair loss but reports no improvement in her joint pain from this treatment.  She has been taking meloxicam daily for a month for joint pain, with no perceived benefit, and previously used Advil, which only helped after taking four to five pills. She experiences sleep disturbances due to pain, leading to poor sleep quality. She was never diagnosed with sleep apnea, but states she used to snore before she lost weight.  She has been experiencing headaches, which she attributes to anxiety and stress related to her current health issues. No bowel problems are reported. She notes memory and concentration difficulties ongoing since COVID-19. She has not been on any muscle relaxers or anxiety medications in the past.  She gets occasional ulcers or sores in the mouth that she attributes to her teeth.  No nasal ulcers.  Has seborrheic dermatitis on the scalp that is chronic but no inflammatory or sun sensitive rashes.  Has not noticed any cervical or axillary lymphadenopathy.  No Raynaud's symptoms.     Labs reviewed 01/2024 ANA 1:80 speckled ESR 2 CRP <3 RF negative   Review of Systems  Constitutional:  Positive for fatigue.  HENT:  Negative for mouth sores and mouth dryness.   Eyes:  Negative for dryness.  Respiratory:   Negative for shortness of breath.   Cardiovascular:  Negative for chest pain and palpitations.  Gastrointestinal:  Negative for blood in stool, constipation and diarrhea.  Endocrine: Negative for increased urination.  Genitourinary:  Negative for involuntary urination.  Musculoskeletal:  Positive for joint pain, joint pain, joint swelling, myalgias, muscle weakness, morning stiffness, muscle tenderness and myalgias. Negative for gait problem.  Skin:  Negative for color change, rash, hair loss and sensitivity to sunlight.  Allergic/Immunologic: Negative for susceptible to infections.  Neurological:  Negative for dizziness and headaches.  Hematological:  Negative for swollen glands.  Psychiatric/Behavioral:  Positive for sleep disturbance. Negative for depressed mood. The patient is not nervous/anxious.     PMFS History:  Patient Active Problem List   Diagnosis Date Noted   Positive ANA (antinuclear antibody) 03/20/2024   Pain in left knee 03/20/2024   Myofascial pain 03/20/2024   Female pattern hair loss 07/16/2022   Seborrheic dermatitis of scalp 07/16/2022   Class 1 obesity with serious comorbidity and body mass index (BMI) of 32.0 to 32.9 in adult 06/10/2020   Other fatigue 05/12/2020   SOB (shortness of breath) on exertion 05/12/2020   Essential hypertension 05/12/2020   Hyperglycemia 05/12/2020   Hyperlipidemia 05/12/2020   Vitamin D  deficiency 05/12/2020   At risk for diabetes mellitus 05/12/2020   Right foot pain 08/15/2018   Acute pain of right shoulder 07/18/2018   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   Prediabetes 12/23/2013   Essential hypertension, benign 12/23/2013    Past Medical History:  Diagnosis Date   Allergy    past hx - used to have allergy shots    Back pain    Back pain    Cataract    both eyes - forming    Glaucoma    Heart murmur    Heartburn    High cholesterol    Hypertension    Neck pain    Prediabetes    Vitamin D  deficiency      Family History  Problem Relation Age of Onset   Hypertension Father    Hyperlipidemia Father    Liver disease Father    Alcoholism Father    Asthma Daughter    Asthma Son    Colon cancer Neg Hx    Colon polyps Neg Hx    Esophageal cancer Neg Hx    Rectal cancer Neg Hx    Stomach cancer Neg Hx    Past Surgical History:  Procedure Laterality Date   CARPAL TUNNEL RELEASE Bilateral 02/2009   right hand   CARPAL TUNNEL RELEASE  03/2009   left hand   OOPHORECTOMY     Patient unsure of side   Social History   Social History Narrative   Not on file   Immunization History  Administered Date(s) Administered   Influenza,inj,Quad PF,6+ Mos 05/04/2020   PFIZER(Purple Top)SARS-COV-2 Vaccination 11/12/2019, 12/09/2019   Tdap 08/15/2018     Objective: Vital  Signs: BP 90/61   Pulse 82   Temp 97.6 F (36.4 C)   Resp 14   Ht 5' 2 (1.575 m)   Wt 132 lb (59.9 kg)   LMP 10/29/2018 Comment: Irregular- last 3-4 months ago   BMI 24.14 kg/m    Physical Exam Eyes:     Conjunctiva/sclera: Conjunctivae normal.  Cardiovascular:     Rate and Rhythm: Normal rate and regular rhythm.  Pulmonary:     Effort: Pulmonary effort is normal.     Breath sounds: Normal breath sounds.  Musculoskeletal:     Right lower leg: No edema.     Left lower leg: No edema.  Skin:    General: Skin is warm and dry.     Findings: No rash.  Neurological:     Mental Status: She is alert.  Psychiatric:        Mood and Affect: Mood normal.      Musculoskeletal Exam:  Widespread tenderness to pressure over muscle areas throughout upper and lower back and arms and legs, some pain radiation downward in the back Shoulders full ROM no tenderness or swelling Fingers full ROM no tenderness or swelling Low back pain provoked with hip internal/external rotation Knees full ROM, left knee with some focal tenderness to pressure at superior border of patella   Investigation: No additional  findings.  Imaging: No results found.  Recent Labs: Lab Results  Component Value Date   WBC 5.0 05/12/2020   HGB 15.2 05/12/2020   PLT 294 05/12/2020   NA 136 05/12/2020   K 4.6 05/12/2020   CL 99 05/12/2020   CO2 22 05/12/2020   GLUCOSE 84 05/12/2020   BUN 12 05/12/2020   CREATININE 0.92 05/12/2020   BILITOT 0.3 05/12/2020   ALKPHOS 60 05/12/2020   AST 17 05/12/2020   ALT 27 05/12/2020   PROT 7.9 05/12/2020   ALBUMIN 4.8 05/12/2020   CALCIUM 9.8 05/12/2020   GFRAA 82 05/12/2020    Speciality Comments: No specialty comments available.  Procedures:  No procedures performed Allergies: Patient has no known allergies.   Assessment / Plan:     Visit Diagnoses: Myofascial pain - Plan: DULoxetine  (CYMBALTA ) 30 MG capsule Myofascial pain syndrome with muscle irritation and guarding. Flexeril  was ineffective. - Discontinue Flexeril . - Start Cymbalta  30 mg daily for joint and myofascial pain. - Educated on Cymbalta  side effects: mood changes, fluid retention, weight gain, importance of daily dosing. - Follow-up check in 4-6 weeks to assess Cymbalta  efficacy and side effects, consider change vs titration  Osteoarthritis and lumbar degenerative disc disease Chronic osteoarthritis and lumbar degenerative disc disease contributing to back pain.  Possible hormone-related arthralgia Possible hormone-related joint pain, potentially menopause-related. Cymbalta  may address joint pain and hot flashes. - Start Cymbalta  30 mg daily for joint pain and hot flashes.        Orders: No orders of the defined types were placed in this encounter.  Meds ordered this encounter  Medications   DULoxetine  (CYMBALTA ) 30 MG capsule    Sig: Take 1 capsule (30 mg total) by mouth daily.    Dispense:  30 capsule    Refill:  1     Follow-Up Instructions: Return in about 3 months (around 08/06/2024) for OA/FMS CYM start f/u 3mos.   Lonni LELON Ester, MD  Note - This record has been created  using [employer].  Chart creation errors have been sought, but may not always  have been located. Such creation errors do not  reflect on  the standard of medical care.

## 2024-05-07 NOTE — Patient Instructions (Addendum)
 We will plan to reach out in about a month to check how the medication is working for you, or you can contact us  if having any problems.  I recommend checking out the [facility] [place]  patient-centered guide for fibromyalgia and chronic pain management: [email or web address]

## 2024-05-21 ENCOUNTER — Other Ambulatory Visit: Payer: Self-pay | Admitting: Internal Medicine

## 2024-05-21 DIAGNOSIS — M7918 Myalgia, other site: Secondary | ICD-10-CM

## 2024-07-07 ENCOUNTER — Other Ambulatory Visit: Payer: Self-pay | Admitting: Internal Medicine

## 2024-07-07 DIAGNOSIS — M7918 Myalgia, other site: Secondary | ICD-10-CM

## 2024-07-07 NOTE — Telephone Encounter (Signed)
 Last Fill: 05/07/2024  Next Visit: 08/21/2024  Last Visit: 05/07/2024  Dx: Myofascial pain   Current Dose per office note on 05/07/2024: Cymbalta  30 mg daily for joint and myofascial pain.   Okay to refill Cymbalta ?

## 2024-08-01 ENCOUNTER — Other Ambulatory Visit: Payer: Self-pay | Admitting: Internal Medicine

## 2024-08-01 DIAGNOSIS — M7918 Myalgia, other site: Secondary | ICD-10-CM

## 2024-08-03 NOTE — Telephone Encounter (Signed)
 Last Fill: 07/07/2024  Next Visit: 08/21/2024  Last Visit: 05/07/2024  Dx: Myofascial pain   Current Dose per office note on 05/07/2024: Cymbalta  30 mg daily for joint and myofascial pain   Okay to refill Cymbalta ?

## 2024-08-17 NOTE — Progress Notes (Unsigned)
 "  Office Visit Note  Patient: Haley Moreno             Date of Birth: 27-Aug-1965           MRN: [id number]             PCP: Benjamine Aland, MD Referring: Benjamine Aland, MD Visit Date: 08/21/2024   Subjective:  No chief complaint on file.   History of Present Illness: Haley Moreno is a 59 y.o. female here for follow up with osteoarthritis and degenerative disc disease who presents for follow-up regarding back pain management.   Previous HPI 05/07/2024 Haley Moreno is a 59 year old female with osteoarthritis and degenerative disc disease who presents for follow-up regarding back pain management.   She is preparing to receive another injection in her back, similar to a previous treatment. The first injection was aimed towards her leg, which provided some relief, allowing her to walk into stores like [employer], although she still experiences significant pain in her back.   She has a history of a positive ANA test with a low titer of 1:80, but subsequent antibody tests for rheumatoid arthritis and lupus were negative.   She tried Flexeril  at night but did not notice any improvement in her symptoms and has since discontinued its use.   She experiences pain in her back that radiates to other areas, and she describes certain areas as 'painful' when pressed, which can cause shooting pain away from the site.   No history of mood disorders. No current symptoms of fluid retention or weight gain.         Previous HPI 03/20/24 Haley Moreno is a 59 year old female who presents with joint pain and a positive ANA test.   She has been experiencing significant joint pain in multiple areas, reports particularly increased since January. Worst pain is in her spine, radiating down to her legs, particularly affecting her knee and shin and worse on the left side. She also describes numbness in her feet and hands. The pain has worsened over time, impacting her ability to  engage in activities such as walking at the park. She describes the pain as severe, requiring her to stop while shopping, and notes that she used to be very active but now struggles to complete even one lap around the park. The pain is described as 'on fire' when touched over the involved areas.   She has been experiencing swelling in her knee and uses a heating pad nightly for comfort, although it does not alleviate the pain. She mentions mouth sores, which she attributes to dental issues. She reports hair loss, particularly thinning in the middle of her scalp, and is currently taking finasteride and minoxidil for this condition. She also uses minoxidil daily for hair loss but reports no improvement in her joint pain from this treatment.   She has been taking meloxicam daily for a month for joint pain, with no perceived benefit, and previously used Advil, which only helped after taking four to five pills. She experiences sleep disturbances due to pain, leading to poor sleep quality. She was never diagnosed with sleep apnea, but states she used to snore before she lost weight.   She has been experiencing headaches, which she attributes to anxiety and stress related to her current health issues. No bowel problems are reported. She notes memory and concentration difficulties ongoing since COVID-19. She has not been on any muscle relaxers or anxiety medications in the  past.    She gets occasional ulcers or sores in the mouth that she attributes to her teeth.  No nasal ulcers.  Has seborrheic dermatitis on the scalp that is chronic but no inflammatory or sun sensitive rashes.  Has not noticed any cervical or axillary lymphadenopathy.  No Raynaud's symptoms.      Labs reviewed 01/2024 ANA 1:80 speckled ESR 2 CRP <3 RF negative   No Rheumatology ROS completed.   PMFS History:  Patient Active Problem List   Diagnosis Date Noted   Positive ANA (antinuclear antibody) 03/20/2024   Pain in left knee  03/20/2024   Myofascial pain 03/20/2024   Female pattern hair loss 07/16/2022   Seborrheic dermatitis of scalp 07/16/2022   Class 1 obesity with serious comorbidity and body mass index (BMI) of 32.0 to 32.9 in adult 06/10/2020   Other fatigue 05/12/2020   SOB (shortness of breath) on exertion 05/12/2020   Essential hypertension 05/12/2020   Hyperglycemia 05/12/2020   Hyperlipidemia 05/12/2020   Vitamin D  deficiency 05/12/2020   At risk for diabetes mellitus 05/12/2020   Right foot pain 08/15/2018   Acute pain of right shoulder 07/18/2018   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   Prediabetes 12/23/2013   Essential hypertension, benign 12/23/2013    Past Medical History:  Diagnosis Date   Allergy    past hx - used to have allergy shots    Back pain    Back pain    Cataract    both eyes - forming    Glaucoma    Heart murmur    Heartburn    High cholesterol    Hypertension    Neck pain    Prediabetes    Vitamin D  deficiency     Family History  Problem Relation Age of Onset   Hypertension Father    Hyperlipidemia Father    Liver disease Father    Alcoholism Father    Asthma Daughter    Asthma Son    Colon cancer Neg Hx    Colon polyps Neg Hx    Esophageal cancer Neg Hx    Rectal cancer Neg Hx    Stomach cancer Neg Hx    Past Surgical History:  Procedure Laterality Date   CARPAL TUNNEL RELEASE Bilateral 02/2009   right hand   CARPAL TUNNEL RELEASE  03/2009   left hand   OOPHORECTOMY     Patient unsure of side   Social History   Social History Narrative   Not on file   Immunization History  Administered Date(s) Administered   Influenza,inj,Quad PF,6+ Mos 05/04/2020   PFIZER(Purple Top)SARS-COV-2 Vaccination 11/12/2019, 12/09/2019   Tdap 08/15/2018     Objective: Vital Signs: LMP 10/29/2018 Comment: Irregular- last 3-4 months ago    Physical Exam   Musculoskeletal Exam: ***  CDAI Exam: CDAI Score: -- Patient Global: --; Provider Global:  -- Swollen: --; Tender: -- Joint Exam 08/21/2024   No joint exam has been documented for this visit   There is currently no information documented on the homunculus. Go to the Rheumatology activity and complete the homunculus joint exam.  Investigation: No additional findings.  Imaging: No results found.  Recent Labs: Lab Results  Component Value Date   WBC 5.0 05/12/2020   HGB 15.2 05/12/2020   PLT 294 05/12/2020   NA 136 05/12/2020   K 4.6 05/12/2020   CL 99 05/12/2020   CO2 22 05/12/2020   GLUCOSE 84 05/12/2020   BUN 12 05/12/2020  CREATININE 0.92 05/12/2020   BILITOT 0.3 05/12/2020   ALKPHOS 60 05/12/2020   AST 17 05/12/2020   ALT 27 05/12/2020   PROT 7.9 05/12/2020   ALBUMIN 4.8 05/12/2020   CALCIUM 9.8 05/12/2020   GFRAA 82 05/12/2020    Speciality Comments: No specialty comments available.  Procedures:  No procedures performed Allergies: Patient has no known allergies.   Assessment / Plan:     Visit Diagnoses: No diagnosis found.  ***  Orders: No orders of the defined types were placed in this encounter.  No orders of the defined types were placed in this encounter.    Follow-Up Instructions: No follow-ups on file.   Taitum Menton M Lillah Standre, CMA  Note - This record has been created using [employer].  Chart creation errors have been sought, but may not always  have been located. Such creation errors do not reflect on  the standard of medical care. "

## 2024-08-21 ENCOUNTER — Ambulatory Visit: Admitting: Internal Medicine

## 2024-08-21 DIAGNOSIS — M7918 Myalgia, other site: Secondary | ICD-10-CM

## 2024-10-07 ENCOUNTER — Ambulatory Visit: Admitting: Internal Medicine
